# Patient Record
Sex: Female | Born: 1935 | Race: White | Hispanic: No | Smoking: Former smoker
Health system: Southern US, Community
[De-identification: ages and names within clinical notes are randomized; demographics above are authoritative.]

## PROBLEM LIST (undated history)

## (undated) DIAGNOSIS — M5416 Radiculopathy, lumbar region: Secondary | ICD-10-CM

## (undated) DIAGNOSIS — M1990 Unspecified osteoarthritis, unspecified site: Secondary | ICD-10-CM

## (undated) DIAGNOSIS — J45909 Unspecified asthma, uncomplicated: Secondary | ICD-10-CM

## (undated) DIAGNOSIS — M7580 Other shoulder lesions, unspecified shoulder: Secondary | ICD-10-CM

## (undated) DIAGNOSIS — I340 Nonrheumatic mitral (valve) insufficiency: Secondary | ICD-10-CM

## (undated) HISTORY — PX: TONSILLECTOMY: SUR1361

## (undated) HISTORY — DX: Nonrheumatic mitral (valve) insufficiency: I34.0

## (undated) HISTORY — DX: Other shoulder lesions, unspecified shoulder: M75.80

## (undated) HISTORY — DX: Unspecified osteoarthritis, unspecified site: M19.90

## (undated) HISTORY — DX: Radiculopathy, lumbar region: M54.16

## (undated) HISTORY — PX: EYE SURGERY: SHX253

---

## 1999-05-13 ENCOUNTER — Other Ambulatory Visit: Admission: RE | Admit: 1999-05-13 | Discharge: 1999-05-13 | Payer: Self-pay | Admitting: Internal Medicine

## 2006-05-10 ENCOUNTER — Encounter: Admission: RE | Admit: 2006-05-10 | Discharge: 2006-05-10 | Payer: Self-pay | Admitting: Internal Medicine

## 2008-01-06 HISTORY — PX: KNEE ARTHROSCOPY W/ MENISCAL REPAIR: SHX1877

## 2013-06-13 ENCOUNTER — Other Ambulatory Visit: Payer: Self-pay | Admitting: Orthopedic Surgery

## 2013-06-14 ENCOUNTER — Encounter (HOSPITAL_BASED_OUTPATIENT_CLINIC_OR_DEPARTMENT_OTHER): Payer: Self-pay | Admitting: *Deleted

## 2013-06-14 NOTE — Progress Notes (Signed)
Pt is extremely sharp and self care-drives, takes care of her home-very little health problems No labs needed

## 2013-06-19 NOTE — H&P (Signed)
  Loretta Todd is an 80 y.o. female.   Chief Complaint: c/o cystic mass left ring finger DIP joint HPI: Loretta Todd is a remarkable 80 year-old woman who looks 20 years younger than her stated age and acts 3930 years younger than her stated age.  She developed a mucoid cyst over the dorsoulnar aspect of her left ring finger three months prior.  She subsequently had a debridement of the cyst with a fairly rapid recurrence. She is now referred for an upper extremity orthopaedic consult.    Past Medical History  Diagnosis Date  . Asthma   . Arthritis     Past Surgical History  Procedure Laterality Date  . Eye surgery      both cataracts  . Knee arthroscopy w/ meniscal repair  2010    right  . Tonsillectomy      History reviewed. No pertinent family history. Social History:  reports that she quit smoking about 17 years ago. She does not have any smokeless tobacco history on file. She reports that she drinks alcohol. She reports that she does not use illicit drugs.  Allergies: No Known Allergies  No prescriptions prior to admission    No results found for this or any previous visit (from the past 48 hour(s)).  No results found.   Pertinent items are noted in HPI.  Height 5\' 4"  (1.626 m), weight 63.504 kg (140 lb).  General appearance: alert Head: Normocephalic, without obvious abnormality Neck: supple, symmetrical, trachea midline Resp: clear to auscultation bilaterally Cardio: regular rate and rhythm GI: normal findings: bowel sounds normal Extremities: .  Inspection of her hands reveals the stigmata of osteoarthritis including Heberden's and Bouchard's nodes.  She has angular deformities of multiple IP joints.  She is able to close her fingertips tight to the palm.  She has a very large 6 mm.  in diameter, thin distended mucoid cyst over the dorsoulnar aspect of her left ring finger.  She has no signs of infection in the form of cellulitis or septic arthritis.  Her neurovascular  examination is intact with intact sensibility to light touch, normal sweat patterns and dermatoglyphics. She has intact sensibility in the median, ulnar and radial distribution and intact pulses and capillary refill.    X-rays of her left ring finger, four views, demonstrate bone-on-bone arthropathy of the DIP joint. The mucoid cyst is easily visualized on plain film.   Pulses: 2+ and symmetric Skin: normal Neurologic: Grossly normal    Assessment/Plan Impression: Left ring finger mucoid cyst of DIP joint  Plan: To the OR for excision of cyst and DIP joint debridement and removal of loose bodies.The procedure, risks,benefits and post-op course were discussed with the patient at length and they were in agreement with the plan.   DASNOIT,Shadavia Dampier J 06/19/2013, 1:48 PM   H&P documentation: 06/20/2013  -History and Physical Reviewed  -Patient has been re-examined  -No change in the plan of care  Loretta Forsterobert V Jazper Nikolai Jr, MD

## 2013-06-20 ENCOUNTER — Ambulatory Visit (HOSPITAL_BASED_OUTPATIENT_CLINIC_OR_DEPARTMENT_OTHER): Payer: Medicare Other | Admitting: Anesthesiology

## 2013-06-20 ENCOUNTER — Encounter (HOSPITAL_BASED_OUTPATIENT_CLINIC_OR_DEPARTMENT_OTHER): Payer: Self-pay

## 2013-06-20 ENCOUNTER — Ambulatory Visit (HOSPITAL_BASED_OUTPATIENT_CLINIC_OR_DEPARTMENT_OTHER)
Admission: RE | Admit: 2013-06-20 | Discharge: 2013-06-20 | Disposition: A | Discharge status: Home / Self Care | Payer: Medicare Other | Source: Ambulatory Visit | Attending: Orthopedic Surgery | Admitting: Orthopedic Surgery

## 2013-06-20 ENCOUNTER — Encounter (HOSPITAL_BASED_OUTPATIENT_CLINIC_OR_DEPARTMENT_OTHER)
Admission: RE | Disposition: A | Discharge status: Home / Self Care | Payer: Self-pay | Source: Ambulatory Visit | Attending: Orthopedic Surgery

## 2013-06-20 ENCOUNTER — Encounter (HOSPITAL_BASED_OUTPATIENT_CLINIC_OR_DEPARTMENT_OTHER): Payer: Medicare Other | Admitting: Anesthesiology

## 2013-06-20 DIAGNOSIS — M19049 Primary osteoarthritis, unspecified hand: Secondary | ICD-10-CM | POA: Insufficient documentation

## 2013-06-20 DIAGNOSIS — L723 Sebaceous cyst: Secondary | ICD-10-CM | POA: Insufficient documentation

## 2013-06-20 DIAGNOSIS — Z87891 Personal history of nicotine dependence: Secondary | ICD-10-CM | POA: Insufficient documentation

## 2013-06-20 DIAGNOSIS — J45909 Unspecified asthma, uncomplicated: Secondary | ICD-10-CM | POA: Insufficient documentation

## 2013-06-20 HISTORY — PX: EAR CYST EXCISION: SHX22

## 2013-06-20 HISTORY — DX: Unspecified asthma, uncomplicated: J45.909

## 2013-06-20 SURGERY — CYST REMOVAL
Anesthesia: General | Site: Finger | Laterality: Left

## 2013-06-20 MED ORDER — FENTANYL CITRATE 0.05 MG/ML IJ SOLN
25.0000 ug | INTRAMUSCULAR | Status: DC | PRN
  Administered 2013-06-20 (×2): 25 ug via INTRAVENOUS

## 2013-06-20 MED ORDER — ONDANSETRON HCL 4 MG/2ML IJ SOLN: 4.0000 mg | Freq: Once | INTRAMUSCULAR | Status: DC | PRN

## 2013-06-20 MED ORDER — LIDOCAINE HCL 2 % IJ SOLN
INTRAMUSCULAR | Status: AC
  Filled 2013-06-20: qty 40

## 2013-06-20 MED ORDER — CHLORHEXIDINE GLUCONATE 4 % EX LIQD: 60.0000 mL | Freq: Once | CUTANEOUS | Status: DC

## 2013-06-20 MED ORDER — ACETAMINOPHEN-CODEINE 300-30 MG PO TABS: 1.0000 | ORAL_TABLET | ORAL | Status: DC | PRN

## 2013-06-20 MED ORDER — FENTANYL CITRATE 0.05 MG/ML IJ SOLN
INTRAMUSCULAR | Status: DC | PRN
  Administered 2013-06-20: 50 ug via INTRAVENOUS

## 2013-06-20 MED ORDER — MIDAZOLAM HCL 2 MG/2ML IJ SOLN: 1.0000 mg | INTRAMUSCULAR | Status: DC | PRN

## 2013-06-20 MED ORDER — FENTANYL CITRATE 0.05 MG/ML IJ SOLN
INTRAMUSCULAR | Status: AC
  Filled 2013-06-20: qty 4

## 2013-06-20 MED ORDER — CEPHALEXIN 500 MG PO CAPS: 500.0000 mg | ORAL_CAPSULE | Freq: Three times a day (TID) | ORAL | Status: DC

## 2013-06-20 MED ORDER — FENTANYL CITRATE 0.05 MG/ML IJ SOLN
INTRAMUSCULAR | Status: AC
  Filled 2013-06-20: qty 2

## 2013-06-20 MED ORDER — LIDOCAINE HCL 2 % IJ SOLN
INTRAMUSCULAR | Status: DC | PRN
  Administered 2013-06-20: 2 mL

## 2013-06-20 MED ORDER — PROPOFOL 10 MG/ML IV BOLUS
INTRAVENOUS | Status: DC | PRN
  Administered 2013-06-20: 50 mg via INTRAVENOUS

## 2013-06-20 MED ORDER — FENTANYL CITRATE 0.05 MG/ML IJ SOLN: 50.0000 ug | INTRAMUSCULAR | Status: DC | PRN

## 2013-06-20 MED ORDER — LACTATED RINGERS IV SOLN
INTRAVENOUS | Status: DC
  Administered 2013-06-20: 09:00:00 via INTRAVENOUS

## 2013-06-20 MED ORDER — MIDAZOLAM HCL 2 MG/2ML IJ SOLN
INTRAMUSCULAR | Status: AC
  Filled 2013-06-20: qty 2

## 2013-06-20 SURGICAL SUPPLY — 51 items
BANDAGE ADH SHEER 1  50/CT (GAUZE/BANDAGES/DRESSINGS) IMPLANT
BANDAGE ELASTIC 3 VELCRO ST LF (GAUZE/BANDAGES/DRESSINGS) IMPLANT
BLADE MINI RND TIP GREEN BEAV (BLADE) IMPLANT
BLADE SURG 15 STRL LF DISP TIS (BLADE) ×1 IMPLANT
BLADE SURG 15 STRL SS (BLADE) ×2
BNDG COHESIVE 1X5 TAN STRL LF (GAUZE/BANDAGES/DRESSINGS) ×3 IMPLANT
BNDG COHESIVE 3X5 TAN STRL LF (GAUZE/BANDAGES/DRESSINGS) IMPLANT
BNDG ESMARK 4X9 LF (GAUZE/BANDAGES/DRESSINGS) IMPLANT
BNDG GAUZE ELAST 4 BULKY (GAUZE/BANDAGES/DRESSINGS) IMPLANT
BRUSH SCRUB EZ PLAIN DRY (MISCELLANEOUS) ×3 IMPLANT
CORDS BIPOLAR (ELECTRODE) IMPLANT
COVER MAYO STAND STRL (DRAPES) ×3 IMPLANT
COVER TABLE BACK 60X90 (DRAPES) ×3 IMPLANT
CUFF TOURNIQUET SINGLE 18IN (TOURNIQUET CUFF) IMPLANT
DECANTER SPIKE VIAL GLASS SM (MISCELLANEOUS) ×3 IMPLANT
DRAIN PENROSE 1/2X12 LTX STRL (WOUND CARE) ×3 IMPLANT
DRAIN PENROSE 1/4X12 LTX STRL (WOUND CARE) IMPLANT
DRAPE EXTREMITY T 121X128X90 (DRAPE) ×3 IMPLANT
DRAPE SURG 17X23 STRL (DRAPES) ×3 IMPLANT
GAUZE SPONGE 4X4 12PLY STRL (GAUZE/BANDAGES/DRESSINGS) IMPLANT
GAUZE XEROFORM 1X8 LF (GAUZE/BANDAGES/DRESSINGS) ×3 IMPLANT
GLOVE BIOGEL M STRL SZ7.5 (GLOVE) ×3 IMPLANT
GLOVE BIOGEL PI IND STRL 7.0 (GLOVE) ×1 IMPLANT
GLOVE BIOGEL PI IND STRL 7.5 (GLOVE) ×1 IMPLANT
GLOVE BIOGEL PI INDICATOR 7.0 (GLOVE) ×2
GLOVE BIOGEL PI INDICATOR 7.5 (GLOVE) ×2
GLOVE ORTHO TXT STRL SZ7.5 (GLOVE) ×3 IMPLANT
GLOVE SURG SS PI 7.5 STRL IVOR (GLOVE) ×3 IMPLANT
GOWN STRL REUS W/ TWL LRG LVL3 (GOWN DISPOSABLE) ×1 IMPLANT
GOWN STRL REUS W/ TWL XL LVL3 (GOWN DISPOSABLE) ×2 IMPLANT
GOWN STRL REUS W/TWL LRG LVL3 (GOWN DISPOSABLE) ×2
GOWN STRL REUS W/TWL XL LVL3 (GOWN DISPOSABLE) ×4
NEEDLE 27GAX1X1/2 (NEEDLE) ×3 IMPLANT
NEEDLE BLUNT 17GA (NEEDLE) ×3 IMPLANT
NS IRRIG 1000ML POUR BTL (IV SOLUTION) ×3 IMPLANT
PACK BASIN DAY SURGERY FS (CUSTOM PROCEDURE TRAY) ×3 IMPLANT
PADDING CAST ABS 4INX4YD NS (CAST SUPPLIES)
PADDING CAST ABS COTTON 4X4 ST (CAST SUPPLIES) IMPLANT
SPLINT FINGER 3.25 911903 (SOFTGOODS) ×3 IMPLANT
STOCKINETTE 4X48 STRL (DRAPES) ×3 IMPLANT
SUT ETHILON 5 0 P 3 18 (SUTURE)
SUT MERSILENE 4 0 P 3 (SUTURE) IMPLANT
SUT NYLON ETHILON 5-0 P-3 1X18 (SUTURE) IMPLANT
SUT PROLENE 3 0 PS 2 (SUTURE) IMPLANT
SUT PROLENE 4 0 P 3 18 (SUTURE) ×3 IMPLANT
SYR 20CC LL (SYRINGE) ×3 IMPLANT
SYR 3ML 23GX1 SAFETY (SYRINGE) IMPLANT
SYR CONTROL 10ML LL (SYRINGE) ×3 IMPLANT
TOWEL OR 17X24 6PK STRL BLUE (TOWEL DISPOSABLE) ×3 IMPLANT
TRAY DSU PREP LF (CUSTOM PROCEDURE TRAY) ×3 IMPLANT
UNDERPAD 30X30 INCONTINENT (UNDERPADS AND DIAPERS) ×3 IMPLANT

## 2013-06-20 NOTE — Addendum Note (Signed)
Addendum created 06/20/13 1129 by Kerby Noraavid A Crews, MD   Modules edited: Clinical Notes   Clinical Notes:  File: [id number]; Pend: [id number]

## 2013-06-20 NOTE — Op Note (Signed)
NAMJudge Stall:  Outen, Dave             ACCOUNT NO.:  [id number]  MEDICAL RECORD NO.:  [id number]  LOCATION:                                 FACILITY:  PHYSICIAN:  Katy Fitchobert V. Lestat Golob, M.D.      DATE OF BIRTH:  DATE OF PROCEDURE:  06/20/2013 DATE OF DISCHARGE:                              OPERATIVE REPORT   PREOPERATIVE DIAGNOSIS:  Chronic left ring finger, degenerative arthritis, distal interphalangeal joint with large dorsal/ulnar mucoid cyst.  POSTOPERATIVE DIAGNOSIS:  Chronic left ring finger, degenerative arthritis, distal interphalangeal joint with large dorsal/ulnar mucoid cyst.  OPERATION:  Debridement of mucoid cyst, followed by formal DIP joint arthrotomy with synovectomy, debridement of marginal osteophytes, and irrigation.  OPERATING SURGEON:  Katy Fitchobert V. Duane Trias, MD.  ASSISTANT:  Marveen Reeksobert J. Dasnoit, PA-C  ANESTHESIA:  2% lidocaine, metacarpal head level block of left ring finger supplemented by IV sedation.  SUPERVISING ANESTHESIOLOGIST:  Sheldon Silvanavid Crews, M.D.  INDICATIONS:  Loretta Todd is an 80 year old woman referred for evaluation and management of a large mucoid cyst on the dorsal ulnar aspect of her left ring finger by Dr. Aris LotWalter Whitworth, attending dermatologist.  Dr. Doreen BeamWhitworth identified a classic mucoid cyst, and requested a Hand Surgery consult.  Ms. Loretta Todd was advised of potential risks and benefits of mucoid cyst surgery.  She understands we cannot affect the natural history of degenerative arthritis of the distal interphalangeal joint.  We will debride the cyst, irrigate the joint, perform a synovectomy, and remove loose bodies.  She understands there is a possibility that she could develop another mucoid cyst of this finger or other fingers.  Questions were invited and answered in detail.  Preoperatively, she was advised that we would provide perioperative antibiotics to try to diminish the risk of infection with joint entry.  Questions were  invited and answered in detail.  PROCEDURE:  Loretta Minksleanor Bulger was interviewed in the holding area and her left ring finger marked as a proper surgical site per protocol with a marking pen.  Dr. Ivin Bootyrews interviewed her and provided detailed anesthesia informed consent.  Digital block anesthesia with IV sedation was recommended and accepted.  PROCEDURE:  Loretta Minksleanor Stamas was then [facility], placed in supine position on the operating table.  Her left ring finger was marked as the proper surgical site per protocol with a marking pen.  The left hand and arm were prepped with Betadine soap and solution, sterilely draped.  A pneumatic tourniquet was deferred.  Our plan was to use a 0.5-inch Penrose drain as a digital tourniquet of the proximal phalangeal segment of the finger.  The finger was exsanguinated and a drain placed as the proper digital tourniquet.  A routine surgical time-out was accomplished.  We then performed a curvilinear incision incorporating the cyst in the distal aspect of the incision.  The cyst was debrided down to the periosteum. We then performed a formal capsulotomy with excision of the capsule between the terminal extensor tendon slip and the ulnar collateral ligament.  Marginal osteophytes at the base of distal phalanx, resected with a micro rongeur followed by synovectomy with a rongeur, followed by removal of a few fragments of  hyaline cartilage and use of a micro curette to remove osteophytes from the base of the distal phalanx.  We then irrigated the joint thoroughly with a 19-gauge blunt dental needle and sterile saline under pressure.  We then repaired the skin with intradermal 4-0 Prolene.  The finger wound was dressed with Xeroflo followed by direct pressure after tourniquet released.  Hemostasis was achieved by 5 minutes of direct pressure.  The wound was then dressed with sterile gauze.  An aluminum foam splint, and a  Coban dressing that was secured to the wrist.  No apparent complications.  For aftercare, Ms. Loretta Todd was provided prescription for Tylenol with Codeine No. 3 one or two tablets p.o. q.4-6 hours p.r.n. pain, 15 tablets refill.  She was encouraged to use either Tylenol or ibuprofen as a primary analgesic.  She is also provided a prescription for Keflex 500 mg 1 p.o. q.8 hours x4 days as a prophylactic antibiotic due to joint entry and bone debridement.     Katy Fitchobert V. Anyi Fels, M.D.     RVS/MEDQ  D:  06/20/2013  T:  06/20/2013  Job:  [id number]  cc:   Aris LotWalter Whitworth, MD

## 2013-06-20 NOTE — Discharge Instructions (Addendum)

## 2013-06-20 NOTE — Transfer of Care (Signed)
Immediate Anesthesia Transfer of Care Note  Patient: Loretta Todd  Procedure(s) Performed: Procedure(s): DEBRIDE MUCOID CYST/REMOVE LOOSE BODIES LEFT RING FINGER  (Left)  Patient Location: PACU  Anesthesia Type:MAC  Level of Consciousness: awake, alert  and oriented  Airway & Oxygen Therapy: Patient Spontanous Breathing  Post-op Assessment: Report given to PACU RN  Post vital signs: Reviewed and stable  Complications: No apparent anesthesia complications

## 2013-06-20 NOTE — Anesthesia Preprocedure Evaluation (Signed)
Anesthesia Evaluation  Patient identified by MRN, date of birth, ID band Patient awake    Reviewed: Allergy & Precautions, H&P , NPO status , Patient's Chart, lab work & pertinent test results  Airway Mallampati: I TM Distance: >3 FB Neck ROM: Full    Dental  (+) Teeth Intact, Dental Advisory Given   Pulmonary former smoker,  breath sounds clear to auscultation        Cardiovascular Rhythm:Regular Rate:Normal     Neuro/Psych    GI/Hepatic   Endo/Other    Renal/GU      Musculoskeletal   Abdominal   Peds  Hematology   Anesthesia Other Findings   Reproductive/Obstetrics                           Anesthesia Physical Anesthesia Plan  ASA: II  Anesthesia Plan: General   Post-op Pain Management:    Induction: Intravenous  Airway Management Planned: LMA  Additional Equipment:   Intra-op Plan:   Post-operative Plan: Extubation in OR  Informed Consent: I have reviewed the patients History and Physical, chart, labs and discussed the procedure including the risks, benefits and alternatives for the proposed anesthesia with the patient or authorized representative who has indicated his/her understanding and acceptance.   Dental advisory given  Plan Discussed with: CRNA, Anesthesiologist and Surgeon  Anesthesia Plan Comments:         Anesthesia Quick Evaluation  

## 2013-06-20 NOTE — Brief Op Note (Signed)
06/20/2013  10:09 AM  PATIENT:  Loretta Todd  80 y.o. female  PRE-OPERATIVE DIAGNOSIS:  Ganglion of tendon sheath [727.42] Localized osteoarthrosis not specified whether primary or secondary, hand [715.34] Articular cartilage disorder, hand [718.04]  POST-OPERATIVE DIAGNOSIS:  Ganglion of tendon sheath [727.42] Localized osteoarthrosis not specified whether primary or secondary, hand [715.34] Articular cartilage disorder, hand [718.04]  PROCEDURE:  Procedure(s): DEBRIDE MUCOID CYST/REMOVE LOOSE BODIES LEFT RING FINGER  (Left)  SURGEON:  Surgeon(s) and Role:    * Wyn Forsterobert Sypher Jr V, MD - Primary  PHYSICIAN ASSISTANT:   ASSISTANTS: Mallory Shirkobert Dasnoit,P.A-C    ANESTHESIA:   local  EBL:  Total I/O In: 450 [I.V.:450] Out: -   BLOOD ADMINISTERED:none  DRAINS: none   LOCAL MEDICATIONS USED:  XYLOCAINE   SPECIMEN:  No Specimen  DISPOSITION OF SPECIMEN:  N/A  COUNTS:  YES  TOURNIQUET:    DICTATION: .Other Dictation: Dictation Number [id number]  PLAN OF CARE: Discharge to home after PACU  PATIENT DISPOSITION:  PACU - hemodynamically stable.   Delay start of Pharmacological VTE agent (>24hrs) due to surgical blood loss or risk of bleeding: not applicable

## 2013-06-20 NOTE — Progress Notes (Signed)
Pt in PACU after excision of mucoid lesion by Dr. Teressa SenterSypher.  She had a 6-8 beat run of bradycardia at a rate of ~30.  She was unaware and completely asymptomatic.  ECG showed a LBBB that has been present for several years according to Dr. Earl Galasborne who was contacted for further follow up and advice.  He felt she could be managed as an outpatient and I am discharging her to her home with her daughter with instructions to wait for a call from Dr. Newell Coralsborne's office for cardiology follow up.  I have faxed a copy of the described events to Dr. Earl Galasborne and will inform Dr. Teressa SenterSypher of the plan.

## 2013-06-20 NOTE — Anesthesia Postprocedure Evaluation (Signed)
  Anesthesia Post-op Note  Patient: Loretta Todd  Procedure(s) Performed: Procedure(s): DEBRIDE MUCOID CYST/REMOVE LOOSE BODIES LEFT RING FINGER  (Left)  Patient Location: PACU  Anesthesia Type:MAC  Level of Consciousness: awake, alert  and oriented  Airway and Oxygen Therapy: Patient Spontanous Breathing  Post-op Pain: none  Post-op Assessment: Post-op Vital signs reviewed  Post-op Vital Signs: Reviewed  Last Vitals:  Filed Vitals:   06/20/13 1015  BP: 138/70  Pulse: 69  Temp:   Resp: 14    Complications: No apparent anesthesia complications

## 2013-06-20 NOTE — Op Note (Signed)
[id number]

## 2013-06-21 ENCOUNTER — Encounter (HOSPITAL_BASED_OUTPATIENT_CLINIC_OR_DEPARTMENT_OTHER): Payer: Self-pay | Admitting: Orthopedic Surgery

## 2013-06-21 LAB — POCT HEMOGLOBIN-HEMACUE: HEMOGLOBIN: 14.9 g/dL (ref 12.0–15.0)

## 2013-06-21 NOTE — Addendum Note (Signed)
Addendum created 06/21/13 [id number] by Lance CoonWesley Webster, CRNA   Modules edited: Charges VN

## 2013-06-27 ENCOUNTER — Encounter: Payer: Self-pay | Admitting: Cardiology

## 2013-06-27 ENCOUNTER — Encounter: Payer: Self-pay | Admitting: General Surgery

## 2013-06-27 ENCOUNTER — Encounter (INDEPENDENT_AMBULATORY_CARE_PROVIDER_SITE_OTHER): Payer: Medicare Other

## 2013-06-27 ENCOUNTER — Ambulatory Visit (INDEPENDENT_AMBULATORY_CARE_PROVIDER_SITE_OTHER): Payer: Medicare Other | Admitting: Cardiology

## 2013-06-27 ENCOUNTER — Encounter: Payer: Self-pay | Admitting: *Deleted

## 2013-06-27 VITALS — BP 122/64 | HR 91 | Ht 64.0 in | Wt 144.0 lb

## 2013-06-27 DIAGNOSIS — I447 Left bundle-branch block, unspecified: Secondary | ICD-10-CM

## 2013-06-27 DIAGNOSIS — R001 Bradycardia, unspecified: Secondary | ICD-10-CM

## 2013-06-27 DIAGNOSIS — I442 Atrioventricular block, complete: Secondary | ICD-10-CM | POA: Insufficient documentation

## 2013-06-27 DIAGNOSIS — I498 Other specified cardiac arrhythmias: Secondary | ICD-10-CM

## 2013-06-27 NOTE — Patient Instructions (Signed)
Your physician recommends that you continue on your current medications as directed. Please refer to the Current Medication list given to you today.  Your physician has recommended that you wear an event monitor. Event monitors are medical devices that record the heart's electrical activity. Doctors most often us these monitors to diagnose arrhythmias. Arrhythmias are problems with the speed or rhythm of the heartbeat. The monitor is a small, portable device. You can wear one while you do your normal daily activities. This is usually used to diagnose what is causing palpitations/syncope (passing out). (schedule ASAP pt leaves for beach after first week of July)  Your physician recommends that you schedule a follow-up appointment in: 4 Weeks with Dr Mayford Knifeurner

## 2013-06-27 NOTE — Progress Notes (Signed)
  [address] [place], [place]  [address] Phone: [phone](336) [phone] Fax:  [phone]) [phone]  Date:  06/27/2013   ID:  Loretta Todd, DOB [birth year], MRN [id number]  PCP:  Darnelle BosSBORNE,JAMES CHARLES, MD  Cardiologist:  Armanda Magicraci Turner, MD     History of Present Illness: Loretta Todd is a 80 y.o. female with a history of asthma who was recently noted in the hospital to have a 6-8 beat run of HR of 30 post op after excision of a mucoid cyst on her finger.  Telemetry strip in PACU showed NSR with 3rd degree AV block.  This occurred in the setting of pain from her finger.  EKG showed LBBB which has been present for several years.  He is not here for further evaluation.  She denies any history of chest pain, SOB, DOE, palpitations, dizziness or syncope.   Wt Readings from Last 3 Encounters:  06/27/13 144 lb (65.318 kg)  06/20/13 143 lb 2 oz (64.921 kg)  06/20/13 143 lb 2 oz (64.921 kg)     Past Medical History  Diagnosis Date  . Asthma   . Arthritis   . OA (osteoarthritis)     DIPs and PIPs  . Lumbar radiculopathy   . Mitral late systolic murmur   . Rotator cuff tendinitis     Current Outpatient Prescriptions  Medication Sig Dispense Refill  . fluticasone-salmeterol (ADVAIR HFA) 115-21 MCG/ACT inhaler Inhale 2 puffs into the lungs 2 (two) times daily.      Marland Kitchen. ibuprofen (ADVIL,MOTRIN) 200 MG tablet Take 200 mg by mouth 2 (two) times daily.       No current facility-administered medications for this visit.    Allergies:   No Known Allergies  Social History:  The patient  reports that she quit smoking about 17 years ago. She does not have any smokeless tobacco history on file. She reports that she drinks alcohol. She reports that she does not use illicit drugs.   Family History:  The patient's family history is not on file.   ROS:  Please see the history of present illness.      All other systems reviewed and negative.   PHYSICAL EXAM: VS:  BP 122/64  Pulse 91  Ht 5\' 4"   (1.626 m)  Wt 144 lb (65.318 kg)  BMI 24.71 kg/m2 Well nourished, well developed, in no acute distress HEENT: normal Neck: no JVD Cardiac:  normal S1, S2; RRR; no murmur Lungs:  clear to auscultation bilaterally, no wheezing, rhonchi or rales Abd: soft, nontender, no hepatomegaly Ext: no edema Skin: warm and dry Neuro:  CNs 2-12 intact, no focal abnormalities noted  EKG:  NSR with LBBB.  Telemetry showed NSR with transient 3rd degree AV block  ASSESSMENT AND PLAN:  1. Transient bradycardia with heart block which was post op and asymptomatic.  This was probably vagally mediated post op.  She is asymptomatic.  I will get an event monitor to assess for transient high grade AV block given her underlying LBBB. 2. Chronic LBBB - this was present at the time of her nuclear stress test in 2004 that showed no ischemia and normal LVF EF 81%  Followup with me in 4 weeks  Signed, Armanda Magicraci Turner, MD 06/27/2013 1:43 PM

## 2013-06-27 NOTE — Progress Notes (Signed)
Patient ID: Loretta Todd, female   DOB: 09-Aug-1921, 80 y.o.   MRN: [id number] [employer] 15 day cardiac event monitor applied to patient.

## 2013-07-25 ENCOUNTER — Telehealth: Payer: Self-pay | Admitting: Cardiology

## 2013-07-25 ENCOUNTER — Encounter: Payer: Self-pay | Admitting: General Surgery

## 2013-07-25 NOTE — Telephone Encounter (Signed)
Please let patient know that heart monitor showed NSR with frequent PAC's and PVC's which are benign

## 2013-07-25 NOTE — Telephone Encounter (Signed)
lmtrc

## 2013-07-26 NOTE — Telephone Encounter (Signed)
LMTRC

## 2013-07-27 ENCOUNTER — Encounter: Payer: Self-pay | Admitting: General Surgery

## 2013-07-27 ENCOUNTER — Telehealth: Payer: Self-pay | Admitting: Internal Medicine

## 2013-07-27 NOTE — Telephone Encounter (Signed)
lmtrc

## 2013-07-27 NOTE — Telephone Encounter (Signed)
ERROR

## 2013-07-27 NOTE — Telephone Encounter (Signed)
Letter sent to pt to make aware.  

## 2013-08-02 ENCOUNTER — Ambulatory Visit (INDEPENDENT_AMBULATORY_CARE_PROVIDER_SITE_OTHER): Payer: Medicare Other | Admitting: Cardiology

## 2013-08-02 ENCOUNTER — Encounter: Payer: Self-pay | Admitting: Cardiology

## 2013-08-02 VITALS — BP 153/85 | HR 88 | Ht 64.0 in | Wt 144.8 lb

## 2013-08-02 DIAGNOSIS — I498 Other specified cardiac arrhythmias: Secondary | ICD-10-CM

## 2013-08-02 DIAGNOSIS — I447 Left bundle-branch block, unspecified: Secondary | ICD-10-CM

## 2013-08-02 DIAGNOSIS — I442 Atrioventricular block, complete: Secondary | ICD-10-CM

## 2013-08-02 DIAGNOSIS — R001 Bradycardia, unspecified: Secondary | ICD-10-CM

## 2013-08-02 NOTE — Patient Instructions (Signed)
Your physician recommends that you continue on your current medications as directed. Please refer to the Current Medication list given to you today.  Your physician recommends that you schedule a follow-up appointment as needed with Dr Turner 

## 2013-08-02 NOTE — Progress Notes (Signed)
  [address] [place], [place]  [address] Phone: [phone](336) [phone] Fax:  [phone]  Date:  08/02/2013   ID:  Loretta Todd, DOB 1921/11/26, MRN [id number]  PCP:  Darnelle BosSBORNE,JAMES CHARLES, MD  Cardiologist:  Armanda Magicraci Akiyah Eppolito, MD     History of Present Illness: Loretta Spikeleanor S Shone is a 80 y.o. female with a history of asthma who was recently noted in the hospital to have a 6-8 beat run of HR of 30 post op after excision of a mucoid cyst on her finger. Telemetry strip in PACU showed NSR with 3rd degree AV block. This occurred in the setting of pain from her finger. EKG showed LBBB which has been present for several years and at the time of her nuclear stress test in 2004 that was normal. It was thought that this was vagally mediated post op. She denies any history of chest pain, SOB, DOE, palpitations, dizziness or syncope.  She wore a heart monitor recently and now presents back for followup.  This showed NSR with PAC's and PVC's.      Wt Readings from Last 3 Encounters:  08/02/13 144 lb 12.8 oz (65.681 kg)  06/27/13 144 lb (65.318 kg)  06/20/13 143 lb 2 oz (64.921 kg)     Past Medical History  Diagnosis Date  . Asthma   . Arthritis   . OA (osteoarthritis)     DIPs and PIPs  . Lumbar radiculopathy   . Mitral late systolic murmur   . Rotator cuff tendinitis     Current Outpatient Prescriptions  Medication Sig Dispense Refill  . fluticasone-salmeterol (ADVAIR HFA) 115-21 MCG/ACT inhaler Inhale 2 puffs into the lungs 2 (two) times daily.      Marland Kitchen. ibuprofen (ADVIL,MOTRIN) 200 MG tablet Take 200 mg by mouth 2 (two) times daily.       No current facility-administered medications for this visit.    Allergies:   No Known Allergies  Social History:  The patient  reports that she quit smoking about 17 years ago. She does not have any smokeless tobacco history on file. She reports that she drinks alcohol. She reports that she does not use illicit drugs.   Family History:  The  patient's family history is not on file.   ROS:  Please see the history of present illness.      All other systems reviewed and negative.   PHYSICAL EXAM: VS:  BP 153/85  Pulse 88  Ht 5\' 4"  (1.626 m)  Wt 144 lb 12.8 oz (65.681 kg)  BMI 24.84 kg/m2 Well nourished, well developed, in no acute distress HEENT: normal Neck: no JVD Cardiac:  normal S1, S2; RRR; no murmur Lungs:  clear to auscultation bilaterally, no wheezing, rhonchi or rales Abd: soft, nontender, no hepatomegaly Ext: no edema Skin: warm and dry Neuro:  CNs 2-12 intact, no focal abnormalities noted       ASSESSMENT AND PLAN:  1. Transient high grade AV block post op from finger surgery with no reoccurence.  Heart monitor did not show any further AV block.  2. PVC's and PAC's - asymptomatic 3. Chronic LBBB with no ischemia at time of nuc 2004  Followup with me PRN  Signed, Armanda Magicraci Jasman Murri, MD 08/02/2013 2:10 PM

## 2013-09-07 ENCOUNTER — Ambulatory Visit (INDEPENDENT_AMBULATORY_CARE_PROVIDER_SITE_OTHER): Payer: Medicare Other | Admitting: Podiatry

## 2013-09-07 ENCOUNTER — Encounter: Payer: Self-pay | Admitting: Podiatry

## 2013-09-07 VITALS — BP 152/90 | HR 84 | Resp 16

## 2013-09-07 DIAGNOSIS — L600 Ingrowing nail: Secondary | ICD-10-CM

## 2013-09-07 NOTE — Patient Instructions (Signed)

## 2013-09-07 NOTE — Progress Notes (Signed)
Subjective:     Patient ID: Loretta Todd, female   DOB: [birth year], 80 y.o.   MRN: [id number]  Toe Pain    patient presents with incurvated hallux borders lateral side on both feet that she states she cannot cut out and has tried to soak and cut out herself without relief of pain or symptoms. States it's been present for a few months and has gotten worse over the last week or 2   Review of Systems  All other systems reviewed and are negative.      Objective:   Physical Exam  Nursing note and vitals reviewed. Constitutional: She is oriented to person, place, and time.  Cardiovascular: Intact distal pulses.   Musculoskeletal: Normal range of motion.  Neurological: She is oriented to person, place, and time.  Skin: Skin is warm.   neurovascular status intact with muscle strength adequate and range of motion subtalar midtarsal joint within normal limits. Patient's found to have incurvated hallux nail bilateral lateral borders with pain and is found to have excellent digital perfusion hair growth and no varicosities. Patient is in very good health and is well oriented x3    Assessment:     Chronic ingrown toenail hallux both feet lateral border    Plan:     H&P and condition discussed. I reviewed the procedure and all risk associated with removal of the corners. Patient would like to have these fixed understanding risk and recovery and today I infiltrated each hallux 60 mg Xylocaine Marcaine mixture remove the lateral border exposed matrix and apply chemical phenol 3 applications followed by alcohol lavaged and sterile dressing. Gave instructions on soaks and reappoint

## 2013-09-07 NOTE — Progress Notes (Signed)
   Subjective:    Patient ID: Loretta Todd, female    DOB: [birth year], 80 y.o.   MRN: [id number]  HPI Comments: "I have 2 ingrown toenails"  Patient c/o tender 1st toe bilateral, both borders, for about 1 week. The area is red and swollen. Soaking and trimming at home.  Toe Pain       Review of Systems  Genitourinary: Positive for frequency.  Hematological: Bruises/bleeds easily.  All other systems reviewed and are negative.      Objective:   Physical Exam        Assessment & Plan:

## 2013-09-20 ENCOUNTER — Encounter: Payer: Self-pay | Admitting: Podiatry

## 2013-09-20 ENCOUNTER — Ambulatory Visit (INDEPENDENT_AMBULATORY_CARE_PROVIDER_SITE_OTHER): Payer: Medicare Other | Admitting: Podiatry

## 2013-09-20 VITALS — BP 136/75 | HR 86 | Resp 16

## 2013-09-20 DIAGNOSIS — L03039 Cellulitis of unspecified toe: Secondary | ICD-10-CM

## 2013-09-20 DIAGNOSIS — L600 Ingrowing nail: Secondary | ICD-10-CM

## 2013-09-20 MED ORDER — CEPHALEXIN 500 MG PO CAPS: 500.0000 mg | ORAL_CAPSULE | Freq: Two times a day (BID) | ORAL | Status: DC

## 2013-09-21 NOTE — Progress Notes (Signed)
Subjective:     Patient ID: Loretta Todd, female   DOB: 1921-08-03, 80 y.o.   MRN: [id number]  HPI patient presents concerned that she has some redness around the sites where we fixed her ingrown toenails on both big toes. States she's noticed a little more on the right and a little more painful for   Review of Systems     Objective:   Physical Exam Neurovascular status unchanged with incurvated nail borders hallux lateral border bilateral that had been removed with slight proximal redness that is localized with no drainage currently and no proximal edema erythema or drainage into the foot    Assessment:     Possible mild local paronychia infection of the hallux right over left    Plan:     Educated her on continuing soaks allowing him to air dry and dry bandaging technique. Also went ahead today as precautionary measure called in cephalexin 500 mg twice a day for the next 10 days and reappoint for [place] to recheck again as needed

## 2013-09-28 ENCOUNTER — Telehealth: Payer: Self-pay | Admitting: *Deleted

## 2013-09-28 ENCOUNTER — Ambulatory Visit (INDEPENDENT_AMBULATORY_CARE_PROVIDER_SITE_OTHER): Payer: Medicare Other | Admitting: Podiatry

## 2013-09-28 ENCOUNTER — Encounter: Payer: Self-pay | Admitting: Podiatry

## 2013-09-28 VITALS — BP 130/80 | HR 72 | Temp 98.2°F | Resp 15

## 2013-09-28 DIAGNOSIS — L03039 Cellulitis of unspecified toe: Secondary | ICD-10-CM

## 2013-09-28 DIAGNOSIS — B351 Tinea unguium: Secondary | ICD-10-CM | POA: Diagnosis not present

## 2013-09-28 MED ORDER — CEPHALEXIN 500 MG PO CAPS: 500.0000 mg | ORAL_CAPSULE | Freq: Two times a day (BID) | ORAL | Status: DC

## 2013-09-28 NOTE — Telephone Encounter (Signed)
I'm a patient of Dr. Charlsie Merles and I'd like to have him call me this afternoon sometime, that would be wonderful.  In the mean time you can give me a call and can let you know what's on my mind.

## 2013-09-29 NOTE — Telephone Encounter (Signed)
I called the patient.  She said she saw Dr. Charlsie Merles yesterday.

## 2013-09-29 NOTE — Progress Notes (Signed)
Subjective:     Patient ID: Loretta Todd, female   DOB: May 18, 1921, 80 y.o.   MRN: [id number]  HPI patient presents stating I still have some discomfort on the big toes of both feet and I was just concerned because there's redness and I want to rest of my toenails cut. States they do not hurt but they're too thick for her to take care of   Review of Systems     Objective:   Physical Exam Neurovascular status unchanged with patient well oriented x3 and presented with daughter. There is localized crusted the lateral side of the hallux both feet and a light pink color but there is no proximal edema erythema or drainage noted teary at nailbeds of remaining nails are not painful but she cannot cut    Assessment:     Localized healing with possible mild localized paronychia infection hallux both feet and nail disease 1-5 of both    Plan:     Continue antibiotic for 10 more days and explained that this Should fall off over the next few weeks but that it may take a little bit time and debridement nailbeds 1-5 both feet with no iatrogenic bleeding noted

## 2013-11-13 ENCOUNTER — Ambulatory Visit (INDEPENDENT_AMBULATORY_CARE_PROVIDER_SITE_OTHER): Payer: Medicare Other | Admitting: Podiatry

## 2013-11-13 DIAGNOSIS — M79673 Pain in unspecified foot: Secondary | ICD-10-CM

## 2013-11-13 DIAGNOSIS — B351 Tinea unguium: Secondary | ICD-10-CM

## 2013-11-14 NOTE — Progress Notes (Signed)
Subjective:     Patient ID: Loretta Todd, female   DOB: July 31, 1921, 80 y.o.   MRN: [id number]  HPIpatient's found to have nail disease 1-5 both feet that are thick and painful when pressed and makes shoe gear difficult. States that she cannot cut herself   Review of Systems     Objective:   Physical Exam Neurovascular status is intact with thick yellow brittle nailbeds 1-5 both feet that are painful when palpated    Assessment:     Mycotic nail infection with pain 1-5 both feet    Plan:     Debride painful nailbeds 1-5 both feet with no iatrogenic bleeding noted

## 2014-01-01 ENCOUNTER — Other Ambulatory Visit: Payer: Medicare Other

## 2014-01-15 ENCOUNTER — Ambulatory Visit (INDEPENDENT_AMBULATORY_CARE_PROVIDER_SITE_OTHER): Payer: Medicare Other | Admitting: Podiatry

## 2014-01-15 DIAGNOSIS — B351 Tinea unguium: Secondary | ICD-10-CM | POA: Diagnosis not present

## 2014-01-15 DIAGNOSIS — M79673 Pain in unspecified foot: Secondary | ICD-10-CM | POA: Diagnosis not present

## 2014-01-15 NOTE — Progress Notes (Signed)
Subjective:     Patient ID: Loretta Todd, female   DOB: 12/17/1921, 81 y.o.   MRN: [id number]  HPIpatient's found to have nail disease 1-5 both feet that are thick and painful when pressed and makes shoe gear difficult. States that she cannot cut herself   Review of Systems     Objective:   Physical Exam Neurovascular status is intact with thick yellow brittle nailbeds 1-5 both feet that are painful when palpated    Assessment:     Mycotic nail infection with pain 1-5 both feet    Plan:     Debride painful nailbeds 1-5 both feet with no iatrogenic bleeding noted      

## 2014-04-05 ENCOUNTER — Encounter: Payer: Self-pay | Admitting: Podiatry

## 2014-04-05 ENCOUNTER — Ambulatory Visit (INDEPENDENT_AMBULATORY_CARE_PROVIDER_SITE_OTHER): Payer: Medicare Other | Admitting: Podiatry

## 2014-04-05 DIAGNOSIS — M79673 Pain in unspecified foot: Secondary | ICD-10-CM

## 2014-04-05 DIAGNOSIS — B351 Tinea unguium: Secondary | ICD-10-CM | POA: Diagnosis not present

## 2014-04-05 NOTE — Progress Notes (Signed)
Subjective:     Patient ID: Loretta Todd, female   DOB: 10/27/1921, 81 y.o.   MRN: [id number]  HPIpatient's found to have nail disease 1-5 both feet that are thick and painful when pressed and makes shoe gear difficult. States that she cannot cut herself   Review of Systems     Objective:   Physical Exam Neurovascular status is intact with thick yellow brittle nailbeds 1-5 both feet that are painful when palpated    Assessment:     Mycotic nail infection with pain 1-5 both feet    Plan:     Debride painful nailbeds 1-5 both feet with no iatrogenic bleeding noted      

## 2014-04-23 ENCOUNTER — Ambulatory Visit: Payer: Medicare Other

## 2014-06-11 ENCOUNTER — Ambulatory Visit (INDEPENDENT_AMBULATORY_CARE_PROVIDER_SITE_OTHER): Payer: Medicare Other | Admitting: Podiatry

## 2014-06-11 DIAGNOSIS — M79673 Pain in unspecified foot: Secondary | ICD-10-CM

## 2014-06-11 DIAGNOSIS — B351 Tinea unguium: Secondary | ICD-10-CM

## 2014-06-11 NOTE — Progress Notes (Signed)
Subjective:     Patient ID: Loretta Todd, female   DOB: May 11, 1921, 81 y.o.   MRN: [id number]  HPIpatient's found to have nail disease 1-5 both feet that are thick and painful when pressed and makes shoe gear difficult. States that she cannot cut herself   Review of Systems     Objective:   Physical Exam Neurovascular status is intact with thick yellow brittle nailbeds 1-5 both feet that are painful when palpated    Assessment:     Mycotic nail infection with pain 1-5 both feet    Plan:     Debride painful nailbeds 1-5 both feet with no iatrogenic bleeding noted

## 2014-08-30 ENCOUNTER — Ambulatory Visit: Payer: Medicare Other | Admitting: Podiatry

## 2014-08-30 ENCOUNTER — Ambulatory Visit (INDEPENDENT_AMBULATORY_CARE_PROVIDER_SITE_OTHER): Payer: Medicare Other | Admitting: Podiatry

## 2014-08-30 VITALS — BP 128/82 | HR 73 | Resp 16

## 2014-08-30 DIAGNOSIS — B351 Tinea unguium: Secondary | ICD-10-CM

## 2014-08-30 DIAGNOSIS — M79673 Pain in unspecified foot: Secondary | ICD-10-CM

## 2014-08-30 NOTE — Progress Notes (Signed)
Subjective:     Patient ID: Loretta Todd, female   DOB: [birth year], 81 y.o.   MRN: [id number]  HPIpatient's found to have nail disease 1-5 both feet that are thick and painful when pressed and makes shoe gear difficult. States that she cannot cut herself. She would like to have him continue to become a regular basis. She states that 3 months may be too long.   Review of Systems     Objective:   Physical Exam Neurovascular status is intact with thick yellow brittle nailbeds 1-5 both feet that are painful when palpated. No open wounds or lesions.    Assessment:     Mycotic nail infection with pain 1-5 both feet. Pain in limb secondary to onychomycosis.     Plan:     Debride painful nailbeds 1-5 both feet with no iatrogenic bleeding noted.   Arbutus Ped DPM

## 2014-12-05 ENCOUNTER — Ambulatory Visit (INDEPENDENT_AMBULATORY_CARE_PROVIDER_SITE_OTHER): Payer: Medicare Other | Admitting: Podiatry

## 2014-12-05 DIAGNOSIS — M779 Enthesopathy, unspecified: Secondary | ICD-10-CM

## 2014-12-05 DIAGNOSIS — B351 Tinea unguium: Secondary | ICD-10-CM

## 2014-12-05 DIAGNOSIS — M79673 Pain in unspecified foot: Secondary | ICD-10-CM

## 2014-12-05 NOTE — Progress Notes (Signed)
Subjective:     Patient ID: Loretta Todd, female   DOB: 1921/02/15, 81 y.o.   MRN: [id number]  HPI patient presents with thick yellow brittle nailbeds 1-5 both feet and inflammation on the inside of the left arch which can bother her quite a bit at times   Review of Systems     Objective:   Physical Exam Neurovascular status unchanged with thick yellow brittle nailbeds 1-5 both feet and also inflammation on the left foot medial side that's painful when pressed    Assessment:     Mycotic nail infection tendinitis left    Plan:     H&P conditions reviewed and discussed tendinitis and treatments. Today I debrided nails and patient be seen back for routine care and possible tendon treatment

## 2015-03-04 ENCOUNTER — Ambulatory Visit (INDEPENDENT_AMBULATORY_CARE_PROVIDER_SITE_OTHER): Payer: Medicare Other | Admitting: Podiatry

## 2015-03-04 DIAGNOSIS — B351 Tinea unguium: Secondary | ICD-10-CM | POA: Diagnosis not present

## 2015-03-04 DIAGNOSIS — M79673 Pain in unspecified foot: Secondary | ICD-10-CM

## 2015-03-05 NOTE — Progress Notes (Signed)
Subjective:     Patient ID: Loretta Todd, female   DOB: 11-19-1921, 82 y.o.   MRN: [id number]  HPI patient presents with nail disease 1-5 both feet with yellow brittle debris and pain when pressed   Review of Systems     Objective:   Physical Exam Neurovascular status intact with thick yellow brittle nailbeds 1-5 both feet that are painful and the corners and she cannot cut    Assessment:     Mycotic nail infections with pain 1-5 both feet    Plan:     Debride painful nailbeds 1-5 both feet with no iatrogenic bleeding noted

## 2015-03-07 DIAGNOSIS — R350 Frequency of micturition: Secondary | ICD-10-CM | POA: Insufficient documentation

## 2015-03-07 DIAGNOSIS — M1611 Unilateral primary osteoarthritis, right hip: Secondary | ICD-10-CM | POA: Insufficient documentation

## 2015-03-07 DIAGNOSIS — M6740 Ganglion, unspecified site: Secondary | ICD-10-CM | POA: Insufficient documentation

## 2015-03-07 DIAGNOSIS — J45909 Unspecified asthma, uncomplicated: Secondary | ICD-10-CM | POA: Insufficient documentation

## 2015-05-27 ENCOUNTER — Ambulatory Visit (INDEPENDENT_AMBULATORY_CARE_PROVIDER_SITE_OTHER): Payer: Medicare Other | Admitting: Podiatry

## 2015-05-27 DIAGNOSIS — B351 Tinea unguium: Secondary | ICD-10-CM | POA: Diagnosis not present

## 2015-05-27 DIAGNOSIS — M79673 Pain in unspecified foot: Secondary | ICD-10-CM | POA: Diagnosis not present

## 2015-05-28 NOTE — Progress Notes (Signed)
Subjective:     Patient ID: Loretta Todd, female   DOB: [birth year], 82 y.o.   MRN: [id number]  HPI patient presents stating I have thick yellow nailbeds 1-5 both feet and they're painful and I cannot cut them   Review of Systems     Objective:   Physical Exam Neurovascular status intact with thick yellow brittle nailbeds 1-5 both feet that are painful corners and difficult to cut    Assessment:     Mycotic nail infection with pain 1-5 both feet    Plan:     Debride painful nailbeds 1-5 both feet with no iatrogenic bleeding noted

## 2015-07-01 DIAGNOSIS — M150 Primary generalized (osteo)arthritis: Secondary | ICD-10-CM

## 2015-07-01 DIAGNOSIS — M159 Polyosteoarthritis, unspecified: Secondary | ICD-10-CM | POA: Insufficient documentation

## 2015-07-01 DIAGNOSIS — R252 Cramp and spasm: Secondary | ICD-10-CM | POA: Insufficient documentation

## 2015-08-26 ENCOUNTER — Ambulatory Visit (INDEPENDENT_AMBULATORY_CARE_PROVIDER_SITE_OTHER): Payer: Medicare Other | Admitting: Podiatry

## 2015-08-26 DIAGNOSIS — M79673 Pain in unspecified foot: Secondary | ICD-10-CM | POA: Diagnosis not present

## 2015-08-26 DIAGNOSIS — B351 Tinea unguium: Secondary | ICD-10-CM

## 2015-08-26 NOTE — Progress Notes (Signed)
Subjective:     Patient ID: Loretta Todd, female   DOB: 1921-01-28, 82 y.o.   MRN: [id number]  HPI patient presents with thick nails 1-5 both feet that are bothersome and she cannot wear shoe gear with   Review of Systems     Objective:   Physical Exam Neurovascular status intact with thick yellow brittle nailbeds 1-5 both feet that become painful and she cannot cut    Assessment:     Mycotic nail infections with pain 1-5 both feet    Plan:     Debride painful nailbeds 1-5 both feet with no iatrogenic bleeding noted

## 2015-12-02 ENCOUNTER — Ambulatory Visit (INDEPENDENT_AMBULATORY_CARE_PROVIDER_SITE_OTHER): Payer: Medicare Other | Admitting: Podiatry

## 2015-12-02 DIAGNOSIS — B351 Tinea unguium: Secondary | ICD-10-CM | POA: Diagnosis not present

## 2015-12-02 DIAGNOSIS — M79675 Pain in left toe(s): Secondary | ICD-10-CM | POA: Diagnosis not present

## 2015-12-02 DIAGNOSIS — M79674 Pain in right toe(s): Secondary | ICD-10-CM | POA: Diagnosis not present

## 2015-12-02 NOTE — Progress Notes (Signed)
Subjective:     Patient ID: Loretta Todd, female   DOB: 07/10/1921, 82 y.o.   MRN: [id number]  HPI patient presents with thick nails 1-5 both feet that are bothersome and she cannot wear shoe gear with   Review of Systems     Objective:   Physical Exam Neurovascular status intact with thick yellow brittle nailbeds 1-5 both feet that become painful and she cannot cut    Assessment:     Mycotic nail infections with pain 1-5 both feet    Plan:     Debride painful nailbeds 1-5 both feet with no iatrogenic bleeding noted     

## 2016-01-28 ENCOUNTER — Inpatient Hospital Stay (HOSPITAL_COMMUNITY)
Admission: EM | Admit: 2016-01-28 | Discharge: 2016-02-01 | DRG: 871 | Disposition: A | Discharge status: Home Health | Payer: Medicare Other | Attending: Internal Medicine | Admitting: Internal Medicine

## 2016-01-28 ENCOUNTER — Encounter (HOSPITAL_COMMUNITY): Payer: Self-pay | Admitting: *Deleted

## 2016-01-28 ENCOUNTER — Emergency Department (HOSPITAL_COMMUNITY): Payer: Medicare Other

## 2016-01-28 ENCOUNTER — Inpatient Hospital Stay (HOSPITAL_COMMUNITY): Payer: Medicare Other

## 2016-01-28 DIAGNOSIS — I4581 Long QT syndrome: Secondary | ICD-10-CM | POA: Diagnosis present

## 2016-01-28 DIAGNOSIS — J111 Influenza due to unidentified influenza virus with other respiratory manifestations: Secondary | ICD-10-CM | POA: Diagnosis not present

## 2016-01-28 DIAGNOSIS — J189 Pneumonia, unspecified organism: Secondary | ICD-10-CM | POA: Diagnosis present

## 2016-01-28 DIAGNOSIS — I480 Paroxysmal atrial fibrillation: Secondary | ICD-10-CM | POA: Diagnosis not present

## 2016-01-28 DIAGNOSIS — I447 Left bundle-branch block, unspecified: Secondary | ICD-10-CM | POA: Diagnosis present

## 2016-01-28 DIAGNOSIS — Z79899 Other long term (current) drug therapy: Secondary | ICD-10-CM | POA: Diagnosis not present

## 2016-01-28 DIAGNOSIS — Z87891 Personal history of nicotine dependence: Secondary | ICD-10-CM | POA: Diagnosis not present

## 2016-01-28 DIAGNOSIS — Z66 Do not resuscitate: Secondary | ICD-10-CM | POA: Diagnosis present

## 2016-01-28 DIAGNOSIS — J9600 Acute respiratory failure, unspecified whether with hypoxia or hypercapnia: Secondary | ICD-10-CM

## 2016-01-28 DIAGNOSIS — W19XXXA Unspecified fall, initial encounter: Secondary | ICD-10-CM | POA: Diagnosis present

## 2016-01-28 DIAGNOSIS — I4891 Unspecified atrial fibrillation: Secondary | ICD-10-CM | POA: Diagnosis present

## 2016-01-28 DIAGNOSIS — R6520 Severe sepsis without septic shock: Secondary | ICD-10-CM | POA: Diagnosis present

## 2016-01-28 DIAGNOSIS — A409 Streptococcal sepsis, unspecified: Secondary | ICD-10-CM | POA: Diagnosis present

## 2016-01-28 DIAGNOSIS — R0602 Shortness of breath: Secondary | ICD-10-CM

## 2016-01-28 DIAGNOSIS — M1990 Unspecified osteoarthritis, unspecified site: Secondary | ICD-10-CM | POA: Diagnosis present

## 2016-01-28 DIAGNOSIS — S51019A Laceration without foreign body of unspecified elbow, initial encounter: Secondary | ICD-10-CM | POA: Diagnosis present

## 2016-01-28 DIAGNOSIS — A419 Sepsis, unspecified organism: Secondary | ICD-10-CM | POA: Diagnosis present

## 2016-01-28 DIAGNOSIS — J9601 Acute respiratory failure with hypoxia: Secondary | ICD-10-CM | POA: Diagnosis present

## 2016-01-28 DIAGNOSIS — J181 Lobar pneumonia, unspecified organism: Secondary | ICD-10-CM | POA: Diagnosis present

## 2016-01-28 DIAGNOSIS — A403 Sepsis due to Streptococcus pneumoniae: Secondary | ICD-10-CM | POA: Diagnosis not present

## 2016-01-28 DIAGNOSIS — M5416 Radiculopathy, lumbar region: Secondary | ICD-10-CM | POA: Diagnosis present

## 2016-01-28 DIAGNOSIS — J1008 Influenza due to other identified influenza virus with other specified pneumonia: Secondary | ICD-10-CM | POA: Diagnosis present

## 2016-01-28 DIAGNOSIS — R9431 Abnormal electrocardiogram [ECG] [EKG]: Secondary | ICD-10-CM | POA: Diagnosis not present

## 2016-01-28 DIAGNOSIS — S51011A Laceration without foreign body of right elbow, initial encounter: Secondary | ICD-10-CM

## 2016-01-28 DIAGNOSIS — F05 Delirium due to known physiological condition: Secondary | ICD-10-CM | POA: Diagnosis present

## 2016-01-28 DIAGNOSIS — I340 Nonrheumatic mitral (valve) insufficiency: Secondary | ICD-10-CM | POA: Diagnosis present

## 2016-01-28 DIAGNOSIS — R262 Difficulty in walking, not elsewhere classified: Secondary | ICD-10-CM

## 2016-01-28 DIAGNOSIS — J45909 Unspecified asthma, uncomplicated: Secondary | ICD-10-CM | POA: Diagnosis present

## 2016-01-28 LAB — CBC WITH DIFFERENTIAL/PLATELET
BASOS ABS: 0 10*3/uL (ref 0.0–0.1)
BASOS PCT: 0 %
Basophils Absolute: 0 10*3/uL (ref 0.0–0.1)
Basophils Relative: 0 %
EOS ABS: 0 10*3/uL (ref 0.0–0.7)
EOS PCT: 0 %
Eosinophils Absolute: 0 10*3/uL (ref 0.0–0.7)
Eosinophils Relative: 0 %
HEMATOCRIT: 42.9 % (ref 36.0–46.0)
HEMATOCRIT: 42.3 % (ref 36.0–46.0)
Hemoglobin: 14.7 g/dL (ref 12.0–15.0)
Hemoglobin: 14.7 g/dL (ref 12.0–15.0)
LYMPHS ABS: 0.5 10*3/uL — AB (ref 0.7–4.0)
Lymphocytes Relative: 2 %
Lymphocytes Relative: 3 %
Lymphs Abs: 0.4 10*3/uL — ABNORMAL LOW (ref 0.7–4.0)
MCH: 32.3 pg (ref 26.0–34.0)
MCH: 32.7 pg (ref 26.0–34.0)
MCHC: 34.3 g/dL (ref 30.0–36.0)
MCHC: 34.8 g/dL (ref 30.0–36.0)
MCV: 94.3 fL (ref 78.0–100.0)
MCV: 94.2 fL (ref 78.0–100.0)
MONO ABS: 0.5 10*3/uL (ref 0.1–1.0)
Monocytes Absolute: 0.6 10*3/uL (ref 0.1–1.0)
Monocytes Relative: 4 %
Monocytes Relative: 3 %
NEUTROS ABS: 15.8 10*3/uL — AB (ref 1.7–7.7)
NEUTROS PCT: 94 %
Neutro Abs: 14.5 10*3/uL — ABNORMAL HIGH (ref 1.7–7.7)
Neutrophils Relative %: 94 %
PLATELETS: 142 10*3/uL — AB (ref 150–400)
PLATELETS: 139 10*3/uL — AB (ref 150–400)
RBC: 4.55 MIL/uL (ref 3.87–5.11)
RBC: 4.49 MIL/uL (ref 3.87–5.11)
RDW: 13.2 % (ref 11.5–15.5)
RDW: 13.2 % (ref 11.5–15.5)
WBC Morphology: INCREASED
WBC: 16.8 10*3/uL — ABNORMAL HIGH (ref 4.0–10.5)
WBC: 15.5 10*3/uL — AB (ref 4.0–10.5)

## 2016-01-28 LAB — COMPREHENSIVE METABOLIC PANEL
ALBUMIN: 3.3 g/dL — AB (ref 3.5–5.0)
ALT: 69 U/L — ABNORMAL HIGH (ref 14–54)
ALT: 64 U/L — ABNORMAL HIGH (ref 14–54)
AST: 76 U/L — AB (ref 15–41)
AST: 73 U/L — AB (ref 15–41)
Albumin: 3.1 g/dL — ABNORMAL LOW (ref 3.5–5.0)
Alkaline Phosphatase: 57 U/L (ref 38–126)
Alkaline Phosphatase: 54 U/L (ref 38–126)
Anion gap: 12 (ref 5–15)
Anion gap: 12 (ref 5–15)
BUN: 20 mg/dL (ref 6–20)
BUN: 20 mg/dL (ref 6–20)
CHLORIDE: 106 mmol/L (ref 101–111)
CHLORIDE: 103 mmol/L (ref 101–111)
CO2: 16 mmol/L — ABNORMAL LOW (ref 22–32)
CO2: 18 mmol/L — AB (ref 22–32)
CREATININE: 1.07 mg/dL — AB (ref 0.44–1.00)
Calcium: 8.5 mg/dL — ABNORMAL LOW (ref 8.9–10.3)
Calcium: 8.4 mg/dL — ABNORMAL LOW (ref 8.9–10.3)
Creatinine, Ser: 1.07 mg/dL — ABNORMAL HIGH (ref 0.44–1.00)
GFR calc Af Amer: 51 mL/min — ABNORMAL LOW (ref >60)
GFR calc non Af Amer: 44 mL/min — ABNORMAL LOW (ref >60)
GFR calc non Af Amer: 44 mL/min — ABNORMAL LOW (ref >60)
GFR, EST AFRICAN AMERICAN: 51 mL/min — AB (ref >60)
GLUCOSE: 143 mg/dL — AB (ref 65–99)
Glucose, Bld: 180 mg/dL — ABNORMAL HIGH (ref 65–99)
POTASSIUM: 4 mmol/L (ref 3.5–5.1)
Potassium: 3.9 mmol/L (ref 3.5–5.1)
SODIUM: 133 mmol/L — AB (ref 135–145)
Sodium: 134 mmol/L — ABNORMAL LOW (ref 135–145)
Total Bilirubin: 0.9 mg/dL (ref 0.3–1.2)
Total Bilirubin: 1 mg/dL (ref 0.3–1.2)
Total Protein: 6 g/dL — ABNORMAL LOW (ref 6.5–8.1)
Total Protein: 5.9 g/dL — ABNORMAL LOW (ref 6.5–8.1)

## 2016-01-28 LAB — I-STAT CHEM 8, ED
BUN: 31 mg/dL — AB (ref 6–20)
CALCIUM ION: 1.02 mmol/L — AB (ref 1.15–1.40)
Chloride: 105 mmol/L (ref 101–111)
Creatinine, Ser: 0.9 mg/dL (ref 0.44–1.00)
Glucose, Bld: 138 mg/dL — ABNORMAL HIGH (ref 65–99)
HCT: 46 % (ref 36.0–46.0)
Hemoglobin: 15.6 g/dL — ABNORMAL HIGH (ref 12.0–15.0)
Potassium: 5.3 mmol/L — ABNORMAL HIGH (ref 3.5–5.1)
Sodium: 135 mmol/L (ref 135–145)
TCO2: 23 mmol/L (ref 0–100)

## 2016-01-28 LAB — URINALYSIS, ROUTINE W REFLEX MICROSCOPIC
Bilirubin Urine: NEGATIVE
Glucose, UA: NEGATIVE mg/dL
Hgb urine dipstick: NEGATIVE
KETONES UR: NEGATIVE mg/dL
LEUKOCYTES UA: NEGATIVE
NITRITE: NEGATIVE
PH: 5 (ref 5.0–8.0)
PROTEIN: NEGATIVE mg/dL
Specific Gravity, Urine: 1.005 (ref 1.005–1.030)

## 2016-01-28 LAB — PROTIME-INR
INR: 1.24
PROTHROMBIN TIME: 15.7 s — AB (ref 11.4–15.2)

## 2016-01-28 LAB — APTT: APTT: 40 s — AB (ref 24–36)

## 2016-01-28 LAB — I-STAT TROPONIN, ED: Troponin i, poc: 0.01 ng/mL (ref 0.00–0.08)

## 2016-01-28 LAB — I-STAT CG4 LACTIC ACID, ED
LACTIC ACID, VENOUS: 2.08 mmol/L — AB (ref 0.5–1.9)
Lactic Acid, Venous: 2 mmol/L (ref 0.5–1.9)

## 2016-01-28 LAB — MAGNESIUM: MAGNESIUM: 2 mg/dL (ref 1.7–2.4)

## 2016-01-28 LAB — TSH: TSH: 0.416 u[IU]/mL (ref 0.350–4.500)

## 2016-01-28 LAB — PROCALCITONIN: Procalcitonin: 8.26 ng/mL

## 2016-01-28 MED ORDER — DEXTROSE 5 % IV SOLN
1.0000 g | INTRAVENOUS | Status: DC
  Filled 2016-01-28: qty 10

## 2016-01-28 MED ORDER — LIDOCAINE-EPINEPHRINE-TETRACAINE (LET) SOLUTION
3.0000 mL | Freq: Once | NASAL | Status: AC
  Administered 2016-01-28: 15:00:00 3 mL via TOPICAL
  Filled 2016-01-28: qty 3

## 2016-01-28 MED ORDER — MOMETASONE FURO-FORMOTEROL FUM 200-5 MCG/ACT IN AERO
2.0000 | INHALATION_SPRAY | Freq: Two times a day (BID) | RESPIRATORY_TRACT | Status: DC
  Filled 2016-01-28 (×2): qty 8.8

## 2016-01-28 MED ORDER — DEXTROSE 5 % IV SOLN
500.0000 mg | Freq: Once | INTRAVENOUS | Status: AC
  Administered 2016-01-28: 500 mg via INTRAVENOUS
  Filled 2016-01-28: qty 500

## 2016-01-28 MED ORDER — SODIUM CHLORIDE 0.9 % IV BOLUS (SEPSIS)
1000.0000 mL | Freq: Once | INTRAVENOUS | Status: AC
  Administered 2016-01-28: 1000 mL via INTRAVENOUS

## 2016-01-28 MED ORDER — IPRATROPIUM-ALBUTEROL 0.5-2.5 (3) MG/3ML IN SOLN
3.0000 mL | RESPIRATORY_TRACT | Status: AC | PRN
  Administered 2016-01-29: 3 mL via RESPIRATORY_TRACT
  Filled 2016-01-28: qty 3

## 2016-01-28 MED ORDER — FUROSEMIDE 10 MG/ML IJ SOLN
40.0000 mg | Freq: Once | INTRAMUSCULAR | Status: AC
  Administered 2016-01-28: 40 mg via INTRAVENOUS
  Filled 2016-01-28: qty 4

## 2016-01-28 MED ORDER — ACETAMINOPHEN 325 MG PO TABS: 650.0000 mg | ORAL_TABLET | Freq: Four times a day (QID) | ORAL | Status: DC | PRN

## 2016-01-28 MED ORDER — SODIUM CHLORIDE 0.9 % IV BOLUS (SEPSIS): 1000.0000 mL | Freq: Once | INTRAVENOUS | Status: DC

## 2016-01-28 MED ORDER — LIDOCAINE-EPINEPHRINE (PF) 2 %-1:200000 IJ SOLN
10.0000 mL | Freq: Once | INTRAMUSCULAR | Status: AC
  Administered 2016-01-28: 10 mL via INTRADERMAL
  Filled 2016-01-28: qty 20

## 2016-01-28 MED ORDER — HEPARIN SODIUM (PORCINE) 5000 UNIT/ML IJ SOLN
5000.0000 [IU] | Freq: Three times a day (TID) | INTRAMUSCULAR | Status: DC
  Filled 2016-01-28: qty 1

## 2016-01-28 MED ORDER — IPRATROPIUM-ALBUTEROL 0.5-2.5 (3) MG/3ML IN SOLN
3.0000 mL | Freq: Four times a day (QID) | RESPIRATORY_TRACT | Status: AC
  Administered 2016-01-29: 3 mL via RESPIRATORY_TRACT
  Filled 2016-01-28 (×2): qty 3

## 2016-01-28 MED ORDER — GUAIFENESIN ER 600 MG PO TB12
1200.0000 mg | ORAL_TABLET | Freq: Two times a day (BID) | ORAL | Status: DC
  Administered 2016-01-29 – 2016-02-01 (×8): 1200 mg via ORAL
  Filled 2016-01-28 (×8): qty 2

## 2016-01-28 MED ORDER — ONDANSETRON HCL 4 MG PO TABS: 4.0000 mg | ORAL_TABLET | Freq: Four times a day (QID) | ORAL | Status: DC | PRN

## 2016-01-28 MED ORDER — POLYETHYLENE GLYCOL 3350 17 G PO PACK: 17.0000 g | PACK | Freq: Every day | ORAL | Status: DC | PRN

## 2016-01-28 MED ORDER — DEXTROSE 5 % IV SOLN
1.0000 g | Freq: Once | INTRAVENOUS | Status: AC
  Administered 2016-01-28: 1 g via INTRAVENOUS
  Filled 2016-01-28: qty 10

## 2016-01-28 MED ORDER — ONDANSETRON HCL 4 MG/2ML IJ SOLN: 4.0000 mg | Freq: Four times a day (QID) | INTRAMUSCULAR | Status: DC | PRN

## 2016-01-28 MED ORDER — ACETAMINOPHEN 650 MG RE SUPP: 650.0000 mg | Freq: Four times a day (QID) | RECTAL | Status: DC | PRN

## 2016-01-28 NOTE — ED Notes (Signed)
Respiratory contacted to reassess need for CB.

## 2016-01-28 NOTE — ED Notes (Signed)
Patient transported to CT 

## 2016-01-28 NOTE — ED Notes (Signed)
Pt given 2 cups of water, graham crackers and a lunch bag. Pt continues to be in the low 90s on room air.

## 2016-01-28 NOTE — ED Notes (Signed)
Dr. Merrell at bedside. 

## 2016-01-28 NOTE — H&P (Signed)
History and Physical    Loretta Spikeleanor S Todd ZOX:[id number] DOB: 1921-07-06 DOA: 01/28/2016  PCP: Delorse LekBURNETT,BRENT A, MD Patient coming from: home  Chief Complaint: progressive weakness HPI: Loretta Todd is a 83 y.o. female with medical history significant of asthma, osteoarthritis, transient third degree AV block and chronic left bundle branch block with known ischemia who presented to the emergency room today with complaints of chills and weakness, shortness of breath worse with exertion and cough, lightheadedness. Patient reported that her symptoms started a couple of days ago were getting progressively worse  ED Course: In the ED patient was hypotensive on presentation with a pressure of 86/70 mmHg and tachycardic with heart rate reaching 148 bpm, she was tachypneic with respirations of 30/m and had elevated white blood cells count to 16,800 Chest x-ray showed right upper and lower lobe airspace disease suspicious for pneumonia Lactic acid and calcitonin levels are pending  Review of Systems: As per HPI otherwise 10 point review of systems negative.   Ambulatory Status: independent, [place]  Past Medical History:  Diagnosis Date  . Arthritis   . Asthma   . Lumbar radiculopathy   . Mitral late systolic murmur   . OA (osteoarthritis)    DIPs and PIPs  . Rotator cuff tendinitis     Past Surgical History:  Procedure Laterality Date  . EAR CYST EXCISION Left 06/20/2013   Procedure: DEBRIDE MUCOID CYST/REMOVE LOOSE BODIES LEFT RING FINGER ;  Surgeon: Wyn Forsterobert Sypher Jr V, MD;  Location: [facility];  Service: Orthopedics;  Laterality: Left;  . EYE SURGERY     both cataracts  . KNEE ARTHROSCOPY W/ MENISCAL REPAIR  2010   right  . TONSILLECTOMY      Social History   Social History  . Marital status: Widowed    Spouse name: N/A  . Number of children: N/A  . Years of education: N/A   Occupational History  . Not on file.   Social History  Main Topics  . Smoking status: Former Smoker    Quit date: 06/14/1996  . Smokeless tobacco: Never Used  . Alcohol use Yes     Comment: daily scotch  . Drug use: No  . Sexual activity: Not on file   Other Topics Concern  . Not on file   Social History Narrative  . No narrative on file    No Known Allergies   History reviewed. No pertinent family history.  Prior to Admission medications   Medication Sig Start Date End Date Taking? Authorizing Provider  Fluticasone-Salmeterol (ADVAIR) 250-50 MCG/DOSE AEPB Inhale 1 puff into the lungs 2 (two) times daily.    Yes Historical Provider, MD  ibuprofen (ADVIL,MOTRIN) 200 MG tablet Take 200 mg by mouth 2 (two) times daily.   Yes Historical Provider, MD    Physical Exam: Vitals:   01/28/16 1515 01/28/16 1530 01/28/16 1545 01/28/16 1600  BP: 121/65 122/79 114/69 114/69  Pulse: (!) 31 93 96 96  Resp: 26 25 (!) 28 24  Temp:      TempSrc:      SpO2: 94% 95% 95% 94%     General: Appears calm and comfortable Eyes: PERRLA, EOMI, normal lids, iris ENT:  grossly normal hearing, lips & tongue, mucous membranes moist and intact Neck: no lymphoadenopathy, masses or thyromegaly Cardiovascular: RRR, no m/r/g. No JVD, carotid bruits. No LE edema.  Respiratory: bilateral coarse rhonchious breath sound. Normal respiratory effort. No accessory muscle use observed Abdomen: soft,  non-tender, non-distended, no organomegaly or masses appreciated. BS present in all quadrants Skin: no rash, ulcers or induration seen on limited exam, right elbow laceration Musculoskeletal: grossly normal tone BUE/BLE, good ROM, no bony abnormality or joint deformities observed Psychiatric: grossly normal mood and affect, speech fluent and appropriate, alert and oriented x3 Neurologic: CN II-XII grossly intact, moves all extremities in coordinated fashion, sensation intact  Labs on Admission: I have personally reviewed following labs and imaging studies  CBC,  BMP  GFR: CrCl cannot be calculated (Unknown ideal weight.).   Creatinine Clearance: CrCl cannot be calculated (Unknown ideal weight.).    Radiological Exams on Admission: Dg Chest 2 View  Result Date: 01/28/2016 CLINICAL DATA:  Cough and flu like symptoms since Sunday. Very week. EXAM: CHEST  2 VIEW COMPARISON:  None. FINDINGS: The heart is upper limits of normal in size. Emphysematous changes are present. Right upper and lower lobe superimposed airspace disease is present. No focal airspace disease is present on the left. The visualized soft tissues and bony thorax are unremarkable. IMPRESSION: 1. Right upper and lower lobe airspace disease concerning for pneumonia. 2. Emphysema. Electronically Signed   By: Marin Roberts M.D.   On: 01/28/2016 12:59   Ct Head Wo Contrast  Result Date: 01/28/2016 CLINICAL DATA:  Patient status post fall. No reported loss consciousness. EXAM: CT HEAD WITHOUT CONTRAST TECHNIQUE: Contiguous axial images were obtained from the base of the skull through the vertex without intravenous contrast. COMPARISON:  None. FINDINGS: Brain: Ventricles and sulci are prominent compatible with atrophy. Periventricular and subcortical white matter hypodensity compatible with chronic microvascular ischemic changes. Old bilateral basal ganglia and left thalamus lacunar infarcts. No acute cortically based infarct, intracranial hemorrhage, mass lesion or mass-effect. Vascular: Internal carotid arterial vascular calcifications. Skull: Intact. Sinuses/Orbits: Paranasal sinuses are well aerated. Orbits are unremarkable. Other: None. IMPRESSION: No acute intracranial process. Chronic microvascular ischemic changes and cortical atrophy. Old bilateral basal ganglia lacunar infarcts. Electronically Signed   By: Annia Belt M.D.   On: 01/28/2016 13:39    EKG: Independently reviewed - atrial fibrillation with bundle branch block, chronic  Assessment/Plan Principal Problem:   Severe  sepsis (HCC) Active Problems:   LBBB (left bundle branch block)   CAP (community acquired pneumonia)   Atrial fibrillation (HCC)   Severe sepsis associated with community acquired pneumonia Patient was admitted on sepsis protocol  Continue Zithromax and Rocephin IV Nebulizers treatments, guaifenesin as needed Continue follow-up white blood cells count and inflammatory  New-onset atrial fibrillation in patient with previous history of high degree AV block and chronic left bundle branch block Cycle cardiac enzymes, telemetry monitoring, Echocardiogram Patient was seen by Dr. Mayford Knife in 2015, Had a negative stress test in 2004 We'll check TSH and magnesium level Consider cardiology consult tomorrow    DVT prophylaxis:  heparin  Code Status:  full  Family Communication:  at bedside  Disposition Plan:  telemetry  Consults called: none Admission status: Inpatient   Raymon Mutton, [place] Pager: [phone] [facility]  If 7PM-7AM, please contact night-coverage [email or web address] Password [facility]  01/28/2016, 4:35 PM

## 2016-01-28 NOTE — ED Notes (Signed)
Phlebotomy called to draw blood cultures

## 2016-01-28 NOTE — ED Notes (Addendum)
Oxygen increased to 3 LPM [place] to maintain Sp02 above 94%.

## 2016-01-28 NOTE — ED Notes (Signed)
Phlebotomy at bedside.

## 2016-01-28 NOTE — Progress Notes (Signed)
Pt is off NIV at this time , titrated to a 2L [place] tolerating it well no distress noted,.

## 2016-01-28 NOTE — ED Triage Notes (Signed)
Pt arrives from home via [facility]. Pt states she has been extremely fatigued and has had some exertional SOB. Pt was only 90% on RA, is currently 95% on 2L [place]. Pt also fell over her dog and has bilateral skin tears to the forearms. Pt daughter states she had the flu a week ago and thinks she passed it to her mother.

## 2016-01-28 NOTE — Progress Notes (Signed)
Pt is on NIV at this time tolerating it well.  

## 2016-01-28 NOTE — ED Notes (Signed)
Xray called for a STAT portable CXR. Pt was clear in all lobes upon arrival and is now having increased tachypnea and coarse crackles that are audible without a stethoscope. Dr. Konrad DoloresMerrell informed.

## 2016-01-28 NOTE — ED Provider Notes (Signed)
[facility]-EMER[facility]ENCY DEPT Provider Note   CSN: [id number] Arrival date & time: 01/28/16  1155     History   Chief Complaint Chief Complaint  Patient presents with  . Fatigue    HPI Loretta Todd is a 83 y.o. female.  83 yo F with a cc of fatigue.  Started on Sunday with chills, cough.  Having some sob, worse on exertion with decreased energy level.  Fell yesterday and was unable to get up.  Having some subjective fevers and chills.  Some lightheadedness upon standing.  Denies urinary complaint.  Denies diarrhea.    The history is provided by the patient.  Illness  This is a new problem. The current episode started more than 2 days ago. The problem occurs constantly. The problem has not changed since onset.Associated symptoms include shortness of breath. Pertinent negatives include no chest pain and no headaches. Nothing aggravates the symptoms. Nothing relieves the symptoms. She has tried nothing for the symptoms. The treatment provided no relief.    Past Medical History:  Diagnosis Date  . Arthritis   . Asthma   . Lumbar radiculopathy   . Mitral late systolic murmur   . OA (osteoarthritis)    DIPs and PIPs  . Rotator cuff tendinitis     Patient Active Problem List   Diagnosis Date Noted  . CAP (community acquired pneumonia) 01/28/2016  . Severe sepsis (HCC) 01/28/2016  . Bradycardia 06/27/2013  . LBBB (left bundle branch block) 06/27/2013  . AV block, 3rd degree (HCC) 06/27/2013    Past Surgical History:  Procedure Laterality Date  . EAR CYST EXCISION Left 06/20/2013   Procedure: DEBRIDE MUCOID CYST/REMOVE LOOSE BODIES LEFT RING FINGER ;  Surgeon: Wyn Forster, MD;  Location: [facility];  Service: Orthopedics;  Laterality: Left;  . EYE SURGERY     both cataracts  . KNEE ARTHROSCOPY W/ MENISCAL REPAIR  2010   right  . TONSILLECTOMY      OB History    No data available       Home Medications    Prior to Admission medications     Medication Sig Start Date End Date Taking? Authorizing Provider  Fluticasone-Salmeterol (ADVAIR) 250-50 MCG/DOSE AEPB Inhale 1 puff into the lungs 2 (two) times daily.    Yes Historical Provider, MD  ibuprofen (ADVIL,MOTRIN) 200 MG tablet Take 200 mg by mouth 2 (two) times daily.   Yes Historical Provider, MD    Family History History reviewed. No pertinent family history.  Social History Social History  Substance Use Topics  . Smoking status: Former Smoker    Quit date: 06/14/1996  . Smokeless tobacco: Never Used  . Alcohol use Yes     Comment: daily scotch     Allergies   Patient has no known allergies.   Review of Systems Review of Systems  Constitutional: Positive for fever (subjective). Negative for chills.  HENT: Positive for congestion. Negative for rhinorrhea.   Eyes: Negative for redness and visual disturbance.  Respiratory: Positive for cough and shortness of breath. Negative for wheezing.   Cardiovascular: Negative for chest pain and palpitations.  Gastrointestinal: Negative for nausea and vomiting.  Genitourinary: Negative for dysuria and urgency.  Musculoskeletal: Negative for arthralgias and myalgias.  Skin: Negative for pallor and wound.  Neurological: Positive for weakness (generalized). Negative for dizziness and headaches.     Physical Exam Updated Vital Signs BP 114/69   Pulse 96   Temp 97.7 F (36.5 C) (Oral)  Resp 24   SpO2 94%   Physical Exam  Constitutional: She is oriented to person, place, and time. She appears well-developed and well-nourished. No distress.  HENT:  Head: Normocephalic and atraumatic.  Eyes: EOM are normal. Pupils are equal, round, and reactive to light.  Neck: Normal range of motion. Neck supple.  Cardiovascular: Normal rate and regular rhythm.  Exam reveals no gallop and no friction rub.   No murmur heard. Pulmonary/Chest: Effort normal. She has no wheezes. She has no rales.  Abdominal: Soft. She exhibits no  distension and no mass. There is no tenderness. There is no guarding.  Musculoskeletal: She exhibits no edema or tenderness.  4cm deep skin tear to right elbow.  1cm skin tear to left forearm. No noted pain with ROM.  No midline C spine tenderness, ROM intact to the neck.  No signs of injury to the head.  Palpated from head to toe with no other noted areas of bony TTP.   Neurological: She is alert and oriented to person, place, and time.  Skin: Skin is warm and dry. She is not diaphoretic.  Psychiatric: She has a normal mood and affect. Her behavior is normal.  Nursing note and vitals reviewed.    ED Treatments / Results  Labs (all labs ordered are listed, but only abnormal results are displayed) Labs Reviewed  CBC WITH DIFFERENTIAL/PLATELET - Abnormal; Notable for the following:       Result Value   WBC 16.8 (*)    Platelets 142 (*)    Neutro Abs 15.8 (*)    Lymphs Abs 0.4 (*)    All other components within normal limits  COMPREHENSIVE METABOLIC PANEL - Abnormal; Notable for the following:    Sodium 134 (*)    CO2 16 (*)    Glucose, Bld 143 (*)    Creatinine, Ser 1.07 (*)    Calcium 8.5 (*)    Total Protein 6.0 (*)    Albumin 3.3 (*)    AST 76 (*)    ALT 69 (*)    GFR calc non Af Amer 44 (*)    GFR calc Af Amer 51 (*)    All other components within normal limits  I-STAT CHEM 8, ED - Abnormal; Notable for the following:    Potassium 5.3 (*)    BUN 31 (*)    Glucose, Bld 138 (*)    Calcium, Ion 1.02 (*)    Hemoglobin 15.6 (*)    All other components within normal limits  CULTURE, BLOOD (ROUTINE X 2)  CULTURE, BLOOD (ROUTINE X 2)  CULTURE, BLOOD (ROUTINE X 2)  CULTURE, BLOOD (ROUTINE X 2)  CULTURE, EXPECTORATED SPUTUM-ASSESSMENT  CULTURE, BLOOD (ROUTINE X 2)  CULTURE, BLOOD (ROUTINE X 2)  CULTURE, EXPECTORATED SPUTUM-ASSESSMENT  GRAM STAIN  URINALYSIS, ROUTINE W REFLEX MICROSCOPIC  COMPREHENSIVE METABOLIC PANEL  PROCALCITONIN  PROTIME-INR  APTT  CBC  CBC WITH  DIFFERENTIAL/PLATELET  HIV ANTIBODY (ROUTINE TESTING)  STREP PNEUMONIAE URINARY ANTIGEN  I-STAT TROPOININ, ED  I-STAT CG4 LACTIC ACID, ED    EKG  EKG Interpretation  Date/Time:  Tuesday January 28 2016 13:12:16 EST Ventricular Rate:  95 PR Interval:    QRS Duration: 134 QT Interval:  414 QTC Calculation: 520 R Axis:   -70 Text Interpretation:  Atrial fibrillation Left axis deviation Non-specific intra-ventricular conduction block Cannot rule out Anteroseptal infarct , age undetermined T wave abnormality, consider lateral ischemia Abnormal ECG No significant change since last tracing Confirmed by Shamia Uppal MD, DANIEL (  [id number]) on 01/28/2016 2:05:30 PM       Radiology Dg Chest 2 View  Result Date: 01/28/2016 CLINICAL DATA:  Cough and flu like symptoms since Sunday. Very week. EXAM: CHEST  2 VIEW COMPARISON:  None. FINDINGS: The heart is upper limits of normal in size. Emphysematous changes are present. Right upper and lower lobe superimposed airspace disease is present. No focal airspace disease is present on the left. The visualized soft tissues and bony thorax are unremarkable. IMPRESSION: 1. Right upper and lower lobe airspace disease concerning for pneumonia. 2. Emphysema. Electronically Signed   By: Marin Roberts M.D.   On: 01/28/2016 12:59   Ct Head Wo Contrast  Result Date: 01/28/2016 CLINICAL DATA:  Patient status post fall. No reported loss consciousness. EXAM: CT HEAD WITHOUT CONTRAST TECHNIQUE: Contiguous axial images were obtained from the base of the skull through the vertex without intravenous contrast. COMPARISON:  None. FINDINGS: Brain: Ventricles and sulci are prominent compatible with atrophy. Periventricular and subcortical white matter hypodensity compatible with chronic microvascular ischemic changes. Old bilateral basal ganglia and left thalamus lacunar infarcts. No acute cortically based infarct, intracranial hemorrhage, mass lesion or mass-effect. Vascular: Internal  carotid arterial vascular calcifications. Skull: Intact. Sinuses/Orbits: Paranasal sinuses are well aerated. Orbits are unremarkable. Other: None. IMPRESSION: No acute intracranial process. Chronic microvascular ischemic changes and cortical atrophy. Old bilateral basal ganglia lacunar infarcts. Electronically Signed   By: Annia Belt M.D.   On: 01/28/2016 13:39    Procedures .Marland KitchenLaceration Repair Date/Time: 01/28/2016 4:20 PM Performed by: Adela Lank Audreyanna Butkiewicz Authorized by: Melene Plan   Consent:    Consent obtained:  Verbal   Consent given by:  Patient   Risks discussed:  Infection, pain and poor cosmetic result   Alternatives discussed:  No treatment and delayed treatment Anesthesia (see MAR for exact dosages):    Anesthesia method:  Topical application and local infiltration   Topical anesthetic:  LET   Local anesthetic:  Lidocaine 1% WITH epi Laceration details:    Location:  Shoulder/arm   Shoulder/arm location:  R elbow   Length (cm):  4 Repair type:    Repair type:  Intermediate Exploration:    Wound exploration: wound explored through full range of motion     Contaminated: no   Treatment:    Wound cleansed with: chlorahexidene.   Amount of cleaning:  Standard   Irrigation solution:  Sterile saline   Irrigation volume:  30   Irrigation method:  Pressure wash   Visualized foreign bodies/material removed: no   Skin repair:    Repair method:  Sutures   Suture size:  4-0   Suture material:  Nylon   Suture technique:  Horizontal mattress   Number of sutures:  6 Approximation:    Approximation:  Close Post-procedure details:    Dressing:  Antibiotic ointment and bulky dressing   Patient tolerance of procedure:  Tolerated well, no immediate complications   (including critical care time)  Medications Ordered in ED Medications  azithromycin (ZITHROMAX) 500 mg in dextrose 5 % 250 mL IVPB (500 mg Intravenous New Bag/Given 01/28/16 1607)  cefTRIAXone (ROCEPHIN) 1 g in dextrose 5 % 50  mL IVPB (not administered)  mometasone-formoterol (DULERA) 200-5 MCG/ACT inhaler 2 puff (not administered)  heparin injection 5,000 Units (not administered)  acetaminophen (TYLENOL) tablet 650 mg (not administered)    Or  acetaminophen (TYLENOL) suppository 650 mg (not administered)  polyethylene glycol (MIRALAX / GLYCOLAX) packet 17 g (not administered)  ondansetron (ZOFRAN) tablet 4 mg (  not administered)    Or  ondansetron (ZOFRAN) injection 4 mg (not administered)  sodium chloride 0.9 % bolus 1,000 mL (1,000 mLs Intravenous New Bag/Given 01/28/16 1607)    And  sodium chloride 0.9 % bolus 1,000 mL (not administered)  sodium chloride 0.9 % bolus 1,000 mL (1,000 mLs Intravenous New Bag/Given 01/28/16 1451)  lidocaine-EPINEPHrine (XYLOCAINE W/EPI) 2 %-1:200000 (PF) injection 10 mL (10 mLs Intradermal Given by Other 01/28/16 1453)  cefTRIAXone (ROCEPHIN) 1 g in dextrose 5 % 50 mL IVPB (0 g Intravenous Stopped 01/28/16 1522)  lidocaine-EPINEPHrine-tetracaine (LET) solution (3 mLs Topical Given by Other 01/28/16 1452)     Initial Impression / Assessment and Plan / ED Course  I have reviewed the triage vital signs and the nursing notes.  Pertinent labs & imaging results that were available during my care of the patient were reviewed by me and considered in my medical decision making (see chart for details).     83 yo F With a chief complaints of cough and subjective fevers and weakness. Going on for the past 3 days. Patient fell yesterday and was unable to get up and this concerned her. Initial oxygen saturation was 90 at rest on room air. Patient states that she feels fine currently. We'll give a fluid bolus. CT head chest x-ray troponin CBC BMP.  Found to have right upper lobe pneumonia on chest x-ray. Give Rocephin and azithromycin. Attempt to ambulate.  Unable to ambulate, patient BP transiently worse, not symptomatic though with SBP <90 will call code sepsis, given 2nd L of fluids up to  30cc/kg. Lactic ordered.     The patients results and plan were reviewed and discussed.   Any x-rays performed were independently reviewed by myself.   Differential diagnosis were considered with the presenting HPI.  Medications  azithromycin (ZITHROMAX) 500 mg in dextrose 5 % 250 mL IVPB (500 mg Intravenous New Bag/Given 01/28/16 1607)  cefTRIAXone (ROCEPHIN) 1 g in dextrose 5 % 50 mL IVPB (not administered)  mometasone-formoterol (DULERA) 200-5 MCG/ACT inhaler 2 puff (not administered)  heparin injection 5,000 Units (not administered)  acetaminophen (TYLENOL) tablet 650 mg (not administered)    Or  acetaminophen (TYLENOL) suppository 650 mg (not administered)  polyethylene glycol (MIRALAX / GLYCOLAX) packet 17 g (not administered)  ondansetron (ZOFRAN) tablet 4 mg (not administered)    Or  ondansetron (ZOFRAN) injection 4 mg (not administered)  sodium chloride 0.9 % bolus 1,000 mL (1,000 mLs Intravenous New Bag/Given 01/28/16 1607)    And  sodium chloride 0.9 % bolus 1,000 mL (not administered)  sodium chloride 0.9 % bolus 1,000 mL (1,000 mLs Intravenous New Bag/Given 01/28/16 1451)  lidocaine-EPINEPHrine (XYLOCAINE W/EPI) 2 %-1:200000 (PF) injection 10 mL (10 mLs Intradermal Given by Other 01/28/16 1453)  cefTRIAXone (ROCEPHIN) 1 g in dextrose 5 % 50 mL IVPB (0 g Intravenous Stopped 01/28/16 1522)  lidocaine-EPINEPHrine-tetracaine (LET) solution (3 mLs Topical Given by Other 01/28/16 1452)    Vitals:   01/28/16 1515 01/28/16 1530 01/28/16 1545 01/28/16 1600  BP: 121/65 122/79 114/69 114/69  Pulse: (!) 31 93 96 96  Resp: 26 25 (!) 28 24  Temp:      TempSrc:      SpO2: 94% 95% 95% 94%    Final diagnoses:  Community acquired pneumonia of right upper lobe of lung (HCC)  Elbow laceration, right, initial encounter    Admission/ observation were discussed with the admitting physician, patient and/or family and they are comfortable with the plan.  Final Clinical Impressions(s) /  ED Diagnoses   Final diagnoses:  Community acquired pneumonia of right upper lobe of lung (HCC)  Elbow laceration, right, initial encounter    New Prescriptions New Prescriptions   No medications on file     Melene Plan, DO 01/28/16 1621

## 2016-01-28 NOTE — ED Notes (Signed)
EKG given to Dr. Floyd 

## 2016-01-29 ENCOUNTER — Inpatient Hospital Stay (HOSPITAL_COMMUNITY): Payer: Medicare Other

## 2016-01-29 DIAGNOSIS — I480 Paroxysmal atrial fibrillation: Secondary | ICD-10-CM

## 2016-01-29 DIAGNOSIS — J9601 Acute respiratory failure with hypoxia: Secondary | ICD-10-CM

## 2016-01-29 DIAGNOSIS — A403 Sepsis due to Streptococcus pneumoniae: Secondary | ICD-10-CM

## 2016-01-29 DIAGNOSIS — J181 Lobar pneumonia, unspecified organism: Secondary | ICD-10-CM

## 2016-01-29 LAB — BLOOD CULTURE ID PANEL (REFLEXED)
Acinetobacter baumannii: NOT DETECTED
CANDIDA GLABRATA: NOT DETECTED
CANDIDA KRUSEI: NOT DETECTED
Candida albicans: NOT DETECTED
Candida parapsilosis: NOT DETECTED
Candida tropicalis: NOT DETECTED
ENTEROBACTER CLOACAE COMPLEX: NOT DETECTED
ESCHERICHIA COLI: NOT DETECTED
Enterobacteriaceae species: NOT DETECTED
Enterococcus species: NOT DETECTED
Haemophilus influenzae: NOT DETECTED
Klebsiella oxytoca: NOT DETECTED
Klebsiella pneumoniae: NOT DETECTED
Listeria monocytogenes: NOT DETECTED
Neisseria meningitidis: NOT DETECTED
PROTEUS SPECIES: NOT DETECTED
PSEUDOMONAS AERUGINOSA: NOT DETECTED
STAPHYLOCOCCUS SPECIES: NOT DETECTED
STREPTOCOCCUS PNEUMONIAE: NOT DETECTED
STREPTOCOCCUS PYOGENES: DETECTED — AB
Serratia marcescens: NOT DETECTED
Staphylococcus aureus (BCID): NOT DETECTED
Streptococcus agalactiae: NOT DETECTED
Streptococcus species: DETECTED — AB

## 2016-01-29 LAB — RESPIRATORY PANEL BY PCR
ADENOVIRUS-RVPPCR: NOT DETECTED
BORDETELLA PERTUSSIS-RVPCR: NOT DETECTED
CORONAVIRUS 229E-RVPPCR: NOT DETECTED
CORONAVIRUS HKU1-RVPPCR: NOT DETECTED
CORONAVIRUS NL63-RVPPCR: NOT DETECTED
CORONAVIRUS OC43-RVPPCR: NOT DETECTED
Chlamydophila pneumoniae: NOT DETECTED
Influenza A H3: DETECTED — AB
Influenza B: NOT DETECTED
METAPNEUMOVIRUS-RVPPCR: NOT DETECTED
Mycoplasma pneumoniae: NOT DETECTED
PARAINFLUENZA VIRUS 1-RVPPCR: NOT DETECTED
PARAINFLUENZA VIRUS 2-RVPPCR: NOT DETECTED
Parainfluenza Virus 3: NOT DETECTED
Parainfluenza Virus 4: NOT DETECTED
Respiratory Syncytial Virus: NOT DETECTED
Rhinovirus / Enterovirus: NOT DETECTED

## 2016-01-29 LAB — BASIC METABOLIC PANEL
Anion gap: 11 (ref 5–15)
BUN: 19 mg/dL (ref 6–20)
CALCIUM: 8.2 mg/dL — AB (ref 8.9–10.3)
CO2: 22 mmol/L (ref 22–32)
CREATININE: 1.01 mg/dL — AB (ref 0.44–1.00)
Chloride: 103 mmol/L (ref 101–111)
GFR, EST AFRICAN AMERICAN: 54 mL/min — AB (ref >60)
GFR, EST NON AFRICAN AMERICAN: 47 mL/min — AB (ref >60)
Glucose, Bld: 110 mg/dL — ABNORMAL HIGH (ref 65–99)
Potassium: 3.6 mmol/L (ref 3.5–5.1)
SODIUM: 136 mmol/L (ref 135–145)

## 2016-01-29 LAB — CBC
HCT: 42.8 % (ref 36.0–46.0)
Hemoglobin: 14.5 g/dL (ref 12.0–15.0)
MCH: 32.2 pg (ref 26.0–34.0)
MCHC: 33.9 g/dL (ref 30.0–36.0)
MCV: 95.1 fL (ref 78.0–100.0)
PLATELETS: 143 10*3/uL — AB (ref 150–400)
RBC: 4.5 MIL/uL (ref 3.87–5.11)
RDW: 13.5 % (ref 11.5–15.5)
WBC: 17.9 10*3/uL — AB (ref 4.0–10.5)

## 2016-01-29 LAB — MRSA PCR SCREENING: MRSA BY PCR: NEGATIVE

## 2016-01-29 LAB — STREP PNEUMONIAE URINARY ANTIGEN: STREP PNEUMO URINARY ANTIGEN: NEGATIVE

## 2016-01-29 LAB — HIV ANTIBODY (ROUTINE TESTING W REFLEX): HIV Screen 4th Generation wRfx: NONREACTIVE

## 2016-01-29 MED ORDER — DILTIAZEM HCL 25 MG/5ML IV SOLN
10.0000 mg | Freq: Four times a day (QID) | INTRAVENOUS | Status: DC | PRN
  Administered 2016-01-29: 10 mg via INTRAVENOUS
  Filled 2016-01-29 (×4): qty 5

## 2016-01-29 MED ORDER — LORAZEPAM 1 MG PO TABS
1.0000 mg | ORAL_TABLET | Freq: Once | ORAL | Status: AC
  Administered 2016-01-29: 1 mg via ORAL
  Filled 2016-01-29: qty 1

## 2016-01-29 MED ORDER — DILTIAZEM HCL 25 MG/5ML IV SOLN
10.0000 mg | Freq: Once | INTRAVENOUS | Status: AC
  Administered 2016-01-29: 10 mg via INTRAVENOUS
  Filled 2016-01-29: qty 5

## 2016-01-29 MED ORDER — ENOXAPARIN SODIUM 40 MG/0.4ML ~~LOC~~ SOLN
40.0000 mg | SUBCUTANEOUS | Status: DC
  Administered 2016-01-30 – 2016-01-31 (×2): 40 mg via SUBCUTANEOUS
  Filled 2016-01-29 (×2): qty 0.4

## 2016-01-29 MED ORDER — DEXTROSE 5 % IV SOLN
2.0000 g | INTRAVENOUS | Status: DC
  Administered 2016-01-29 – 2016-02-01 (×4): 2 g via INTRAVENOUS
  Filled 2016-01-29 (×4): qty 2

## 2016-01-29 MED ORDER — MORPHINE SULFATE (PF) 2 MG/ML IV SOLN
2.0000 mg | Freq: Once | INTRAVENOUS | Status: AC
  Administered 2016-01-29: 2 mg via INTRAVENOUS
  Filled 2016-01-29: qty 1

## 2016-01-29 MED ORDER — OSELTAMIVIR PHOSPHATE 30 MG PO CAPS
30.0000 mg | ORAL_CAPSULE | Freq: Two times a day (BID) | ORAL | Status: DC
  Administered 2016-01-29 – 2016-02-01 (×6): 30 mg via ORAL
  Filled 2016-01-29 (×6): qty 1

## 2016-01-29 MED ORDER — ZOLPIDEM TARTRATE 5 MG PO TABS: 5.0000 mg | ORAL_TABLET | Freq: Every evening | ORAL | Status: DC | PRN

## 2016-01-29 MED ORDER — ORAL CARE MOUTH RINSE
15.0000 mL | Freq: Two times a day (BID) | OROMUCOSAL | Status: DC
  Administered 2016-01-29 – 2016-02-01 (×4): 15 mL via OROMUCOSAL

## 2016-01-29 MED ORDER — HYDRALAZINE HCL 20 MG/ML IJ SOLN
10.0000 mg | Freq: Once | INTRAMUSCULAR | Status: AC
  Administered 2016-01-29: 10 mg via INTRAVENOUS
  Filled 2016-01-29: qty 1

## 2016-01-29 NOTE — ED Notes (Signed)
Pt and family wish to withhold heparin until they can discuss medication with MD.

## 2016-01-29 NOTE — Progress Notes (Signed)
PHARMACY - PHYSICIAN COMMUNICATION CRITICAL VALUE ALERT - BLOOD CULTURE IDENTIFICATION (BCID)  Results for orders placed or performed during the hospital encounter of 01/28/16  Blood Culture ID Panel (Reflexed) (Collected: 01/28/2016  2:36 PM)  Result Value Ref Range   Enterococcus species NOT DETECTED NOT DETECTED   Listeria monocytogenes NOT DETECTED NOT DETECTED   Staphylococcus species NOT DETECTED NOT DETECTED   Staphylococcus aureus NOT DETECTED NOT DETECTED   Streptococcus species DETECTED (A) NOT DETECTED   Streptococcus agalactiae NOT DETECTED NOT DETECTED   Streptococcus pneumoniae NOT DETECTED NOT DETECTED   Streptococcus pyogenes DETECTED (A) NOT DETECTED   Acinetobacter baumannii NOT DETECTED NOT DETECTED   Enterobacteriaceae species NOT DETECTED NOT DETECTED   Enterobacter cloacae complex NOT DETECTED NOT DETECTED   Escherichia coli NOT DETECTED NOT DETECTED   Klebsiella oxytoca NOT DETECTED NOT DETECTED   Klebsiella pneumoniae NOT DETECTED NOT DETECTED   Proteus species NOT DETECTED NOT DETECTED   Serratia marcescens NOT DETECTED NOT DETECTED   Haemophilus influenzae NOT DETECTED NOT DETECTED   Neisseria meningitidis NOT DETECTED NOT DETECTED   Pseudomonas aeruginosa NOT DETECTED NOT DETECTED   Candida albicans NOT DETECTED NOT DETECTED   Candida glabrata NOT DETECTED NOT DETECTED   Candida krusei NOT DETECTED NOT DETECTED   Candida parapsilosis NOT DETECTED NOT DETECTED   Candida tropicalis NOT DETECTED NOT DETECTED    Name of physician (or Provider) Contacted: Dr. Gonzella Lexhungel  Changes to prescribed antibiotics required: Will increase Ceftriaxone to 2 grams IV every 24 hours to cover for bacteremia and PNA; however, if PNA not of concern would recommend deescalating to PenG IV for treatment of GAS bacteremia.   Pollyann SamplesAndy Rebakah Cokley, PharmD, BCPS 01/29/2016, 11:04 AM

## 2016-01-29 NOTE — Progress Notes (Signed)
Tamiflu 30mg  BID x5 days flu treatment dose adjusted for CrCl 30-7960ml/min  Leota SauersLisa Yomara Toothman Pharm.D. CPP, BCPS Clinical Pharmacist [phone] 01/29/2016 2:56 PM

## 2016-01-29 NOTE — Progress Notes (Signed)
[facility](Schorr, MD) paged and notified that patient HR still in 130s after 2mL of Cardizem. Patient still has increased work of breathing. Order to give additional dose of 2mL of Cardizem, 2mg  of Morphine, and get a chest xray was given. Will initiate orders and continue to monitor patient.

## 2016-01-29 NOTE — Progress Notes (Addendum)
PROGRESS NOTE                                                                                                                                                                                                             Patient Demographics:    Loretta Todd, is a 83 y.o. female, DOB - [birth year], WUJ:[id number]  Admit date - 01/28/2016   Admitting Physician Alba Cory, MD  Outpatient Primary MD for the patient is Delorse Lek, MD  LOS - 1  Outpatient Specialists: NONE  Chief Complaint  Patient presents with  . Fatigue       Brief Narrative   83 year old female with history of asthma, osteoarthritis, transient third-degree AV block (transient following finger surgery  in 2015 with no recurrence) chronic left bundle-branch block (negative for ischemia on stress test in 2004) who presented to the ED with chills with weakness, increasing shortness of breath worse with exertion and cough and lightheadedness for past few days. In the ED she was septic with hypotension, tachycardia and tachypnea with leukocytosis (WBC 17 K) chest x-ray showed multifocal pneumonia. Lactic acid was elevated. Admitted to stepdown unit for further management. Patient received IV fluid bolus for sepsis protocol in the ED following which she went into acute pulmonary edema. Improved after stopping fluids and given IV Lasix.   Subjective:   Breathing better today. Now has new-onset A. fib with heart rate up to 120s.   Assessment  & Plan :    Principal Problem:   Severe sepsis (HCC) Secondary to multilobar pneumonia. Blood culture on admission growing Streptococcus species. Will narrow antibiotic to Rocephin (also has prolonged QTC so will avoid macrolides). When necessary nebs. Respiratory viral panel pending. Sepsis improving. Supportive care with Tylenol and antitussives.   Active Problems: New onset atrial fibrillation Possibly  associated with sepsis. Check TSH. Added when necessary IV Cardizem. Although there is history of third-degree AV block this is likely transient in the PACU following removal of cyst of her finger. She did not have any further symptoms.  Acute respiratory failure with hypoxia (HCC) Secondary to lobar pneumonia. Plan as above. Follow respiratory viral panel.     LBBB (left bundle branch block) Chronic.  Prolonged QTC Check repeat EKG. Avoid QT prolonging agents.   Sundowning with confusion Will add bedtime ambien for sleep. Avoid Seroquel or  Haldol given prolonged QT.    Code Status : DO NOT RESUSCITATE  Family Communication  : Daughter at bedside  Disposition Plan  : Home pending clinical improvement, possibly in the next 48 hours  Barriers For Discharge : Active symptoms  Consults  : None  Procedures  : None  DVT Prophylaxis  :  Lovenox -   Lab Results  Component Value Date   PLT 143 (L) 01/29/2016    Antibiotics  :    Anti-infectives    Start     Dose/Rate Route Frequency Ordered Stop   01/29/16 1500  cefTRIAXone (ROCEPHIN) 1 g in dextrose 5 % 50 mL IVPB  Status:  Discontinued     1 g 100 mL/hr over 30 Minutes Intravenous Every 24 hours 01/28/16 1544 01/29/16 1059   01/29/16 1115  cefTRIAXone (ROCEPHIN) 2 g in dextrose 5 % 50 mL IVPB     2 g 100 mL/hr over 30 Minutes Intravenous Every 24 hours 01/29/16 1100     01/28/16 1345  cefTRIAXone (ROCEPHIN) 1 g in dextrose 5 % 50 mL IVPB     1 g 100 mL/hr over 30 Minutes Intravenous  Once 01/28/16 1336 01/28/16 1522   01/28/16 1345  azithromycin (ZITHROMAX) 500 mg in dextrose 5 % 250 mL IVPB     500 mg 250 mL/hr over 60 Minutes Intravenous  Once 01/28/16 1336 01/28/16 1707        Objective:   Vitals:   01/29/16 1000 01/29/16 1200 01/29/16 1243 01/29/16 1300  BP:   122/79   Pulse: (!) 108 (!) 112 94 (!) 115  Resp: (!) 31 (!) 25 20 (!) 26  Temp:   98.1 F (36.7 C)   TempSrc:   Oral   SpO2: 96% 96% 97% 95%    Weight:      Height:        Wt Readings from Last 3 Encounters:  01/29/16 62.5 kg (137 lb 12.6 oz)  08/02/13 65.7 kg (144 lb 12.8 oz)  06/27/13 65.3 kg (144 lb)     Intake/Output Summary (Last 24 hours) at 01/29/16 1350 Last data filed at 01/29/16 1202  Gross per 24 hour  Intake               50 ml  Output             1100 ml  Net            -1050 ml     Physical Exam  Gen: not in distress HEENT:  moist mucosa, supple neck Chest: Lateral rhonchi over right lung, no crackles CVS: S1 and S2 is irregularly irregular, no murmurs, rubs or gallop GI: soft, NT, ND, BS+ Musculoskeletal: warm, no edema CNS: Alert and oriented 2-3, nonfocal    Data Review:    CBC  Recent Labs Lab 01/28/16 1315 01/28/16 1344 01/28/16 1639 01/29/16 0210  WBC 16.8*  --  15.5* 17.9*  HGB 14.7 15.6* 14.7 14.5  HCT 42.9 46.0 42.3 42.8  PLT 142*  --  139* 143*  MCV 94.3  --  94.2 95.1  MCH 32.3  --  32.7 32.2  MCHC 34.3  --  34.8 33.9  RDW 13.2  --  13.2 13.5  LYMPHSABS 0.4*  --  0.5*  --   MONOABS 0.6  --  0.5  --   EOSABS 0.0  --  0.0  --   BASOSABS 0.0  --  0.0  --     Chemistries  Recent Labs Lab 01/28/16 1315 01/28/16 1344 01/28/16 1639 01/29/16 0210  NA 134* 135 133* 136  K 3.9 5.3* 4.0 3.6  CL 106 105 103 103  CO2 16*  --  18* 22  GLUCOSE 143* 138* 180* 110*  BUN 20 31* 20 19  CREATININE 1.07* 0.90 1.07* 1.01*  CALCIUM 8.5*  --  8.4* 8.2*  MG  --   --  2.0  --   AST 76*  --  73*  --   ALT 69*  --  64*  --   ALKPHOS 57  --  54  --   BILITOT 0.9  --  1.0  --    ------------------------------------------------------------------------------------------------------------------ No results for input(s): CHOL, HDL, LDLCALC, TRIG, CHOLHDL, LDLDIRECT in the last 72 hours.  No results found for: HGBA1C ------------------------------------------------------------------------------------------------------------------  Recent Labs  01/28/16 1639  TSH 0.416    ------------------------------------------------------------------------------------------------------------------ No results for input(s): VITAMINB12, FOLATE, FERRITIN, TIBC, IRON, RETICCTPCT in the last 72 hours.  Coagulation profile  Recent Labs Lab 01/28/16 1639  INR 1.24    No results for input(s): DDIMER in the last 72 hours.  Cardiac Enzymes No results for input(s): CKMB, TROPONINI, MYOGLOBIN in the last 168 hours.  Invalid input(s): CK ------------------------------------------------------------------------------------------------------------------ No results found for: BNP  Inpatient Medications  Scheduled Meds: . cefTRIAXone (ROCEPHIN)  IV  2 g Intravenous Q24H  . guaiFENesin  1,200 mg Oral BID  . heparin  5,000 Units Subcutaneous Q8H  . ipratropium-albuterol  3 mL Nebulization Q6H  . mouth rinse  15 mL Mouth Rinse BID  . mometasone-formoterol  2 puff Inhalation BID   Continuous Infusions: PRN Meds:.acetaminophen **OR** acetaminophen, diltiazem, ipratropium-albuterol **FOLLOWED BY** ipratropium-albuterol, ondansetron **OR** ondansetron (ZOFRAN) IV, polyethylene glycol, zolpidem  Micro Results Recent Results (from the past 240 hour(s))  Blood culture (routine x 2)     Status: None (Preliminary result)   Collection Time: 01/28/16  2:36 PM  Result Value Ref Range Status   Specimen Description BLOOD RIGHT ANTECUBITAL  Final   Special Requests BOTTLES DRAWN AEROBIC AND ANAEROBIC 5CC  Final   Culture  Setup Time   Final    GRAM POSITIVE COCCI IN CHAINS AEROBIC BOTTLE ONLY Organism ID to follow CRITICAL RESULT CALLED TO, READ BACK BY AND VERIFIED WITH: ADagoberto Ligas.D. 10:45 01/29/16 ([email or web address])    Culture GRAM POSITIVE COCCI  Final   Report Status PENDING  Incomplete  Blood Culture ID Panel (Reflexed)     Status: Abnormal   Collection Time: 01/28/16  2:36 PM  Result Value Ref Range Status   Enterococcus species NOT DETECTED NOT DETECTED Final   Listeria  monocytogenes NOT DETECTED NOT DETECTED Final   Staphylococcus species NOT DETECTED NOT DETECTED Final   Staphylococcus aureus NOT DETECTED NOT DETECTED Final   Streptococcus species DETECTED (A) NOT DETECTED Final    Comment: CRITICAL RESULT CALLED TO, READ BACK BY AND VERIFIED WITH: ADagoberto Ligas.D. 10:45 01/29/16 ([email or web address])    Streptococcus agalactiae NOT DETECTED NOT DETECTED Final   Streptococcus pneumoniae NOT DETECTED NOT DETECTED Final   Streptococcus pyogenes DETECTED (A) NOT DETECTED Final    Comment: CRITICAL RESULT CALLED TO, READ BACK BY AND VERIFIED WITH: ADagoberto Ligas.D. 10:45 01/29/16 ([email or web address])    Acinetobacter baumannii NOT DETECTED NOT DETECTED Final   Enterobacteriaceae species NOT DETECTED NOT DETECTED Final   Enterobacter cloacae complex NOT DETECTED NOT DETECTED Final   Escherichia coli NOT DETECTED NOT DETECTED Final   Klebsiella oxytoca NOT DETECTED NOT DETECTED Final  Klebsiella pneumoniae NOT DETECTED NOT DETECTED Final   Proteus species NOT DETECTED NOT DETECTED Final   Serratia marcescens NOT DETECTED NOT DETECTED Final   Haemophilus influenzae NOT DETECTED NOT DETECTED Final   Neisseria meningitidis NOT DETECTED NOT DETECTED Final   Pseudomonas aeruginosa NOT DETECTED NOT DETECTED Final   Candida albicans NOT DETECTED NOT DETECTED Final   Candida glabrata NOT DETECTED NOT DETECTED Final   Candida krusei NOT DETECTED NOT DETECTED Final   Candida parapsilosis NOT DETECTED NOT DETECTED Final   Candida tropicalis NOT DETECTED NOT DETECTED Final  MRSA PCR Screening     Status: None   Collection Time: 01/29/16 12:29 AM  Result Value Ref Range Status   MRSA by PCR NEGATIVE NEGATIVE Final    Comment:        The GeneXpert MRSA Assay (FDA approved for NASAL specimens only), is one component of a comprehensive MRSA colonization surveillance program. It is not intended to diagnose MRSA infection nor to guide or monitor treatment for MRSA infections.       Radiology Reports Dg Chest 2 View  Result Date: 01/28/2016 CLINICAL DATA:  Cough and flu like symptoms since Sunday. Very week. EXAM: CHEST  2 VIEW COMPARISON:  None. FINDINGS: The heart is upper limits of normal in size. Emphysematous changes are present. Right upper and lower lobe superimposed airspace disease is present. No focal airspace disease is present on the left. The visualized soft tissues and bony thorax are unremarkable. IMPRESSION: 1. Right upper and lower lobe airspace disease concerning for pneumonia. 2. Emphysema. Electronically Signed   By: Marin Robertshristopher  Mattern M.D.   On: 01/28/2016 12:59   Ct Head Wo Contrast  Result Date: 01/28/2016 CLINICAL DATA:  Patient status post fall. No reported loss consciousness. EXAM: CT HEAD WITHOUT CONTRAST TECHNIQUE: Contiguous axial images were obtained from the base of the skull through the vertex without intravenous contrast. COMPARISON:  None. FINDINGS: Brain: Ventricles and sulci are prominent compatible with atrophy. Periventricular and subcortical white matter hypodensity compatible with chronic microvascular ischemic changes. Old bilateral basal ganglia and left thalamus lacunar infarcts. No acute cortically based infarct, intracranial hemorrhage, mass lesion or mass-effect. Vascular: Internal carotid arterial vascular calcifications. Skull: Intact. Sinuses/Orbits: Paranasal sinuses are well aerated. Orbits are unremarkable. Other: None. IMPRESSION: No acute intracranial process. Chronic microvascular ischemic changes and cortical atrophy. Old bilateral basal ganglia lacunar infarcts. Electronically Signed   By: Annia Beltrew  Davis M.D.   On: 01/28/2016 13:39   Dg Chest Port 1 View  Result Date: 01/29/2016 CLINICAL DATA:  Acute respiratory failure EXAM: PORTABLE CHEST 1 VIEW COMPARISON:  01/28/2016 FINDINGS: Improving aeration throughout the left lung. Continued areas of consolidation in the right upper lobe and right lower lobe, slightly improved  since prior study. Mild cardiomegaly. No visible effusions. IMPRESSION: Persistent but improving areas of consolidation in the right lung concerning for pneumonia. Improved airspace disease throughout the left lung. Electronically Signed   By: Charlett NoseKevin  Dover M.D.   On: 01/29/2016 08:43   Dg Chest Portable 1 View  Result Date: 01/28/2016 CLINICAL DATA:  83 year old female with worsening shortness of breath and rales. EXAM: PORTABLE CHEST 1 VIEW COMPARISON:  Chest x-ray 01/28/2016. FINDINGS: Marked worsening diffuse peribronchial cuffing and patchy interstitial/airspace opacities throughout the lungs bilaterally (right greater than left), most confluent in the right upper lobe. No definite pleural effusions. Heart size is borderline enlarged. Upper mediastinal contours are within normal limits. IMPRESSION: 1. The appearance the chest suggests progressive multilobar bronchopneumonia. Electronically Signed  By: Trudie Reed M.D.   On: 01/28/2016 19:23    Time Spent in minutes  35   Eddie North M.D on 01/29/2016 at 1:50 PM  Between 7am to 7pm - Pager - [phone]  After 7pm go to [email or web address] - password [email or web address]  [facility] -  Office  [phone]

## 2016-01-29 NOTE — Progress Notes (Signed)
Patient attempted to get out of bed after bowel movement. HR 130-140s Afib, RR 40s, patient wheezing. Prn breathing treatment given 2mL of Cardizem IV given prn. Patient requests to be left alone to sleep. Will continue monitoring patient.

## 2016-01-30 ENCOUNTER — Inpatient Hospital Stay (HOSPITAL_COMMUNITY): Payer: Medicare Other

## 2016-01-30 DIAGNOSIS — J111 Influenza due to unidentified influenza virus with other respiratory manifestations: Secondary | ICD-10-CM

## 2016-01-30 DIAGNOSIS — R9431 Abnormal electrocardiogram [ECG] [EKG]: Secondary | ICD-10-CM

## 2016-01-30 LAB — BASIC METABOLIC PANEL
ANION GAP: 10 (ref 5–15)
BUN: 25 mg/dL — AB (ref 6–20)
CO2: 22 mmol/L (ref 22–32)
Calcium: 8.3 mg/dL — ABNORMAL LOW (ref 8.9–10.3)
Chloride: 104 mmol/L (ref 101–111)
Creatinine, Ser: 0.9 mg/dL (ref 0.44–1.00)
GFR calc Af Amer: >60 mL/min (ref >60)
GFR, EST NON AFRICAN AMERICAN: 54 mL/min — AB (ref >60)
GLUCOSE: 126 mg/dL — AB (ref 65–99)
POTASSIUM: 3.6 mmol/L (ref 3.5–5.1)
SODIUM: 136 mmol/L (ref 135–145)

## 2016-01-30 LAB — ECHOCARDIOGRAM COMPLETE
AOPV: 0.42 m/s
AOVTI: 45 cm
AV Area VTI: 1.08 cm2
AV VEL mean LVOT/AV: 0.41
AV peak Index: 0.65
AV vel: 1.11
AVAREAMEANV: 1.05 cm2
AVAREAMEANVIN: 0.63 cm2/m2
AVAREAVTIIND: 0.66 cm2/m2
AVG: 13 mmHg
AVPG: 22 mmHg
AVPKVEL: 236 cm/s
DOP CAL AO MEAN VELOCITY: 165 cm/s
FS: 29 % (ref 28–44)
Height: 64 in
IV/PV OW: 0.9
LA diam end sys: 31 mm
LA diam index: 1.86 cm/m2
LA vol index: 30.8 mL/m2
LASIZE: 31 mm
LAVOL: 51.4 mL
LAVOLA4C: 54 mL
LVOT VTI: 19.6 cm
LVOT area: 2.54 cm2
LVOT diameter: 18 mm
LVOT peak VTI: 0.44 cm
LVOT peak vel: 100 cm/s
LVOTSV: 50 mL
PW: 11.8 mm — AB (ref 0.6–1.1)
Valve area index: 0.66
Valve area: 1.11 cm2
WEIGHTICAEL: 2204.6 [oz_av]

## 2016-01-30 LAB — CBC
HCT: 40.8 % (ref 36.0–46.0)
Hemoglobin: 13.3 g/dL (ref 12.0–15.0)
MCH: 31.3 pg (ref 26.0–34.0)
MCHC: 32.6 g/dL (ref 30.0–36.0)
MCV: 96 fL (ref 78.0–100.0)
PLATELETS: 142 10*3/uL — AB (ref 150–400)
RBC: 4.25 MIL/uL (ref 3.87–5.11)
RDW: 13.4 % (ref 11.5–15.5)
WBC: 13.2 10*3/uL — AB (ref 4.0–10.5)

## 2016-01-30 MED ORDER — FUROSEMIDE 10 MG/ML IJ SOLN
40.0000 mg | Freq: Once | INTRAMUSCULAR | Status: AC
  Administered 2016-01-30: 40 mg via INTRAVENOUS
  Filled 2016-01-30: qty 4

## 2016-01-30 MED ORDER — DILTIAZEM HCL 60 MG PO TABS: 60.0000 mg | ORAL_TABLET | Freq: Three times a day (TID) | ORAL | Status: DC

## 2016-01-30 MED ORDER — DILTIAZEM HCL 60 MG PO TABS
60.0000 mg | ORAL_TABLET | Freq: Three times a day (TID) | ORAL | Status: DC
  Administered 2016-01-30 – 2016-01-31 (×3): 60 mg via ORAL
  Filled 2016-01-30 (×3): qty 1

## 2016-01-30 MED ORDER — LORAZEPAM 1 MG PO TABS
1.0000 mg | ORAL_TABLET | Freq: Once | ORAL | Status: AC
  Administered 2016-01-30: 1 mg via ORAL
  Filled 2016-01-30: qty 1

## 2016-01-30 NOTE — Progress Notes (Addendum)
Pt transferred to 5w27 from 4N. Pt is A&OX4. Pt's family at bedside. Pt's arms and legs bilateral are bruised. Pt on 3L of O2. Placed pt on telemetry. Pt is a high fall risk placed on bed alarm and told to call for assistance before getting up. Pt stated understanding. Told pt and family that pt in camera room, pt and family states that this is good. Will continue to monitor pt .Jerrye BushyJenny Thacker,RN

## 2016-01-30 NOTE — Progress Notes (Addendum)
PROGRESS NOTE                                                                                                                                                                                                             Patient Demographics:    Loretta Todd, is a 83 y.o. female, DOB - 03-17-1921, IEP:[id number]  Admit date - 01/28/2016   Admitting Physician Alba Cory, MD  Outpatient Primary MD for the patient is Delorse Lek, MD  LOS - 2  Outpatient Specialists: NONE  Chief Complaint  Patient presents with  . Fatigue       Brief Narrative   83 year old female with history of asthma, osteoarthritis, transient third-degree AV block (transient following finger surgery  in 2015 with no recurrence) chronic left bundle-branch block (negative for ischemia on stress test in 2004) who presented to the ED with chills with weakness, increasing shortness of breath worse with exertion and cough and lightheadedness for past few days. In the ED she was septic with hypotension, tachycardia and tachypnea with leukocytosis (WBC 17 K) chest x-ray showed multifocal pneumonia. Lactic acid was elevated. Admitted to stepdown unit for further management. Patient received IV fluid bolus for sepsis protocol in the ED following which she went into acute pulmonary edema. Improved after stopping fluids and given IV Lasix.   Subjective:   Symptoms better. Occasional episodes of rapid A. fib on the monitor.   Assessment  & Plan :    Principal Problem:   Severe sepsis (HCC) Secondary to multilobar pneumonia and influenza A. Blood culture on admission growing Streptococcus species. Narrowed antibiotics to Rocephin. Added Tamiflu. Sepsis resolved. Sepsis improving. Supportive care with Tylenol and antitussives.   Active Problems: New onset atrial fibrillation with RVR Possibly secondary to sepsis. TSH normal. Placed on scheduled  Cardizem with better rate control.   Although there is history of third-degree AV block this is likely transient in the PACU following removal of cyst of her finger. She did not have any further symptoms. 2-D echo shows EF of 60-65% with normal wall motion, mild AR and moderate MR. Mildly increased PA pressure (38 mmHg)   Acute respiratory failure with hypoxia (HCC) Secondary to lobar pneumonia. Plan as above. Follow respiratory viral panel.     LBBB (left bundle branch block) Chronic.  Prolonged QTC  Avoid QT prolonging  agents.   Sundowning with confusion Added bedtime ambien for sleep with good response. Avoid Seroquel or Haldol given prolonged QT.    Code Status : DO NOT RESUSCITATE  Family Communication  : Daughter at bedside  Disposition Plan  : Home pending clinical improvement, possibly in the next 48 hours.   Transfer to telemetry. Discontinue Foley, PT evaluation.  Barriers For Discharge : Active symptoms  Consults  : None  Procedures  : None  DVT Prophylaxis  :  Lovenox -   Lab Results  Component Value Date   PLT 142 (L) 01/30/2016    Antibiotics  :    Anti-infectives    Start     Dose/Rate Route Frequency Ordered Stop   01/29/16 1800  oseltamivir (TAMIFLU) capsule 30 mg     30 mg Oral 2 times daily 01/29/16 1451 02/03/16 2159   01/29/16 1500  cefTRIAXone (ROCEPHIN) 1 g in dextrose 5 % 50 mL IVPB  Status:  Discontinued     1 g 100 mL/hr over 30 Minutes Intravenous Every 24 hours 01/28/16 1544 01/29/16 1059   01/29/16 1115  cefTRIAXone (ROCEPHIN) 2 g in dextrose 5 % 50 mL IVPB     2 g 100 mL/hr over 30 Minutes Intravenous Every 24 hours 01/29/16 1100     01/28/16 1345  cefTRIAXone (ROCEPHIN) 1 g in dextrose 5 % 50 mL IVPB     1 g 100 mL/hr over 30 Minutes Intravenous  Once 01/28/16 1336 01/28/16 1522   01/28/16 1345  azithromycin (ZITHROMAX) 500 mg in dextrose 5 % 250 mL IVPB     500 mg 250 mL/hr over 60 Minutes Intravenous  Once 01/28/16 1336  01/28/16 1707        Objective:   Vitals:   01/30/16 0400 01/30/16 0700 01/30/16 0800 01/30/16 1100  BP:  104/72 99/74 110/65  Pulse: 100 86 93 86  Resp: 20 19 20  (!) 23  Temp:  97.6 F (36.4 C) (!) 96.7 F (35.9 C) 97.1 F (36.2 C)  TempSrc:  Axillary Axillary Oral  SpO2: 98% 98% 96% 96%  Weight:      Height:        Wt Readings from Last 3 Encounters:  01/29/16 62.5 kg (137 lb 12.6 oz)  08/02/13 65.7 kg (144 lb 12.8 oz)  06/27/13 65.3 kg (144 lb)     Intake/Output Summary (Last 24 hours) at 01/30/16 1321 Last data filed at 01/30/16 1040  Gross per 24 hour  Intake                0 ml  Output             1652 ml  Net            -1652 ml     Physical Exam  Gen: not in distress HEENT:  moist mucosa, supple neck Chest:Scattered rhonchi over right lung (improved) CVS: S1 and S2 is irregularly irregular, no murmurs, rubs or gallop GI: soft, NT, ND,  Musculoskeletal: warm, no edema CNS: Alert and oriented , nonfocal    Data Review:    CBC  Recent Labs Lab 01/28/16 1315 01/28/16 1344 01/28/16 1639 01/29/16 0210 01/30/16 0211  WBC 16.8*  --  15.5* 17.9* 13.2*  HGB 14.7 15.6* 14.7 14.5 13.3  HCT 42.9 46.0 42.3 42.8 40.8  PLT 142*  --  139* 143* 142*  MCV 94.3  --  94.2 95.1 96.0  MCH 32.3  --  32.7 32.2 31.3  MCHC  34.3  --  34.8 33.9 32.6  RDW 13.2  --  13.2 13.5 13.4  LYMPHSABS 0.4*  --  0.5*  --   --   MONOABS 0.6  --  0.5  --   --   EOSABS 0.0  --  0.0  --   --   BASOSABS 0.0  --  0.0  --   --     Chemistries   Recent Labs Lab 01/28/16 1315 01/28/16 1344 01/28/16 1639 01/29/16 0210 01/30/16 0211  NA 134* 135 133* 136 136  K 3.9 5.3* 4.0 3.6 3.6  CL 106 105 103 103 104  CO2 16*  --  18* 22 22  GLUCOSE 143* 138* 180* 110* 126*  BUN 20 31* 20 19 25*  CREATININE 1.07* 0.90 1.07* 1.01* 0.90  CALCIUM 8.5*  --  8.4* 8.2* 8.3*  MG  --   --  2.0  --   --   AST 76*  --  73*  --   --   ALT 69*  --  64*  --   --   ALKPHOS 57  --  54  --   --    BILITOT 0.9  --  1.0  --   --    ------------------------------------------------------------------------------------------------------------------ No results for input(s): CHOL, HDL, LDLCALC, TRIG, CHOLHDL, LDLDIRECT in the last 72 hours.  No results found for: HGBA1C ------------------------------------------------------------------------------------------------------------------  Recent Labs  01/28/16 1639  TSH 0.416   ------------------------------------------------------------------------------------------------------------------ No results for input(s): VITAMINB12, FOLATE, FERRITIN, TIBC, IRON, RETICCTPCT in the last 72 hours.  Coagulation profile  Recent Labs Lab 01/28/16 1639  INR 1.24    No results for input(s): DDIMER in the last 72 hours.  Cardiac Enzymes No results for input(s): CKMB, TROPONINI, MYOGLOBIN in the last 168 hours.  Invalid input(s): CK ------------------------------------------------------------------------------------------------------------------ No results found for: BNP  Inpatient Medications  Scheduled Meds: . cefTRIAXone (ROCEPHIN)  IV  2 g Intravenous Q24H  . diltiazem  60 mg Oral Q8H  . enoxaparin (LOVENOX) injection  40 mg Subcutaneous Q24H  . guaiFENesin  1,200 mg Oral BID  . mouth rinse  15 mL Mouth Rinse BID  . mometasone-formoterol  2 puff Inhalation BID  . oseltamivir  30 mg Oral BID   Continuous Infusions: PRN Meds:.acetaminophen **OR** acetaminophen, diltiazem, [EXPIRED] ipratropium-albuterol **FOLLOWED BY** ipratropium-albuterol, ondansetron **OR** ondansetron (ZOFRAN) IV, polyethylene glycol  Micro Results Recent Results (from the past 240 hour(s))  Blood culture (routine x 2)     Status: Abnormal (Preliminary result)   Collection Time: 01/28/16  2:36 PM  Result Value Ref Range Status   Specimen Description BLOOD RIGHT ANTECUBITAL  Final   Special Requests BOTTLES DRAWN AEROBIC AND ANAEROBIC 5CC  Final   Culture   Setup Time   Final    GRAM POSITIVE COCCI IN CHAINS AEROBIC BOTTLE ONLY CRITICAL RESULT CALLED TO, READ BACK BY AND VERIFIED WITH: ADagoberto Ligas.D. 10:45 01/29/16 ([email or web address])    Culture (A)  Final    STREPTOCOCCUS PYOGENES SUSCEPTIBILITIES TO FOLLOW    Report Status PENDING  Incomplete  Blood Culture ID Panel (Reflexed)     Status: Abnormal   Collection Time: 01/28/16  2:36 PM  Result Value Ref Range Status   Enterococcus species NOT DETECTED NOT DETECTED Final   Listeria monocytogenes NOT DETECTED NOT DETECTED Final   Staphylococcus species NOT DETECTED NOT DETECTED Final   Staphylococcus aureus NOT DETECTED NOT DETECTED Final   Streptococcus species DETECTED (A) NOT DETECTED Final    Comment:  CRITICAL RESULT CALLED TO, READ BACK BY AND VERIFIED WITH: ADagoberto Ligas.D. 10:45 01/29/16 ([email or web address])    Streptococcus agalactiae NOT DETECTED NOT DETECTED Final   Streptococcus pneumoniae NOT DETECTED NOT DETECTED Final   Streptococcus pyogenes DETECTED (A) NOT DETECTED Final    Comment: CRITICAL RESULT CALLED TO, READ BACK BY AND VERIFIED WITH: ADagoberto Ligas.D. 10:45 01/29/16 ([email or web address])    Acinetobacter baumannii NOT DETECTED NOT DETECTED Final   Enterobacteriaceae species NOT DETECTED NOT DETECTED Final   Enterobacter cloacae complex NOT DETECTED NOT DETECTED Final   Escherichia coli NOT DETECTED NOT DETECTED Final   Klebsiella oxytoca NOT DETECTED NOT DETECTED Final   Klebsiella pneumoniae NOT DETECTED NOT DETECTED Final   Proteus species NOT DETECTED NOT DETECTED Final   Serratia marcescens NOT DETECTED NOT DETECTED Final   Haemophilus influenzae NOT DETECTED NOT DETECTED Final   Neisseria meningitidis NOT DETECTED NOT DETECTED Final   Pseudomonas aeruginosa NOT DETECTED NOT DETECTED Final   Candida albicans NOT DETECTED NOT DETECTED Final   Candida glabrata NOT DETECTED NOT DETECTED Final   Candida krusei NOT DETECTED NOT DETECTED Final   Candida parapsilosis NOT DETECTED  NOT DETECTED Final   Candida tropicalis NOT DETECTED NOT DETECTED Final  Blood culture (routine x 2)     Status: None (Preliminary result)   Collection Time: 01/28/16  2:40 PM  Result Value Ref Range Status   Specimen Description BLOOD RIGHT WRIST  Final   Special Requests BOTTLES DRAWN AEROBIC AND ANAEROBIC 5CC  Final   Culture NO GROWTH 1 DAY  Final   Report Status PENDING  Incomplete  Respiratory Panel by PCR     Status: Abnormal   Collection Time: 01/29/16 12:28 AM  Result Value Ref Range Status   Adenovirus NOT DETECTED NOT DETECTED Final   Coronavirus 229E NOT DETECTED NOT DETECTED Final   Coronavirus HKU1 NOT DETECTED NOT DETECTED Final   Coronavirus NL63 NOT DETECTED NOT DETECTED Final   Coronavirus OC43 NOT DETECTED NOT DETECTED Final   Metapneumovirus NOT DETECTED NOT DETECTED Final   Rhinovirus / Enterovirus NOT DETECTED NOT DETECTED Final   Influenza A H3 DETECTED (A) NOT DETECTED Final   Influenza B NOT DETECTED NOT DETECTED Final   Parainfluenza Virus 1 NOT DETECTED NOT DETECTED Final   Parainfluenza Virus 2 NOT DETECTED NOT DETECTED Final   Parainfluenza Virus 3 NOT DETECTED NOT DETECTED Final   Parainfluenza Virus 4 NOT DETECTED NOT DETECTED Final   Respiratory Syncytial Virus NOT DETECTED NOT DETECTED Final   Bordetella pertussis NOT DETECTED NOT DETECTED Final   Chlamydophila pneumoniae NOT DETECTED NOT DETECTED Final   Mycoplasma pneumoniae NOT DETECTED NOT DETECTED Final  MRSA PCR Screening     Status: None   Collection Time: 01/29/16 12:29 AM  Result Value Ref Range Status   MRSA by PCR NEGATIVE NEGATIVE Final    Comment:        The GeneXpert MRSA Assay (FDA approved for NASAL specimens only), is one component of a comprehensive MRSA colonization surveillance program. It is not intended to diagnose MRSA infection nor to guide or monitor treatment for MRSA infections.     Radiology Reports Dg Chest 2 View  Result Date: 01/28/2016 CLINICAL DATA:   Cough and flu like symptoms since Sunday. Very week. EXAM: CHEST  2 VIEW COMPARISON:  None. FINDINGS: The heart is upper limits of normal in size. Emphysematous changes are present. Right upper and lower lobe superimposed airspace disease is present. No focal airspace disease  is present on the left. The visualized soft tissues and bony thorax are unremarkable. IMPRESSION: 1. Right upper and lower lobe airspace disease concerning for pneumonia. 2. Emphysema. Electronically Signed   By: Marin Roberts M.D.   On: 01/28/2016 12:59   Ct Head Wo Contrast  Result Date: 01/28/2016 CLINICAL DATA:  Patient status post fall. No reported loss consciousness. EXAM: CT HEAD WITHOUT CONTRAST TECHNIQUE: Contiguous axial images were obtained from the base of the skull through the vertex without intravenous contrast. COMPARISON:  None. FINDINGS: Brain: Ventricles and sulci are prominent compatible with atrophy. Periventricular and subcortical white matter hypodensity compatible with chronic microvascular ischemic changes. Old bilateral basal ganglia and left thalamus lacunar infarcts. No acute cortically based infarct, intracranial hemorrhage, mass lesion or mass-effect. Vascular: Internal carotid arterial vascular calcifications. Skull: Intact. Sinuses/Orbits: Paranasal sinuses are well aerated. Orbits are unremarkable. Other: None. IMPRESSION: No acute intracranial process. Chronic microvascular ischemic changes and cortical atrophy. Old bilateral basal ganglia lacunar infarcts. Electronically Signed   By: Annia Belt M.D.   On: 01/28/2016 13:39   Dg Chest Port 1 View  Result Date: 01/29/2016 CLINICAL DATA:  Dyspnea EXAM: PORTABLE CHEST 1 VIEW COMPARISON:  Prior studies dating back through 01/28/2016 FINDINGS: Cardiomegaly with aortic atherosclerosis. Interval mild increase in vascular congestion consistent with CHF with superimposed right upper and lower lobe airspace disease suggestive of a move to the the pneumonia.  No acute osseous abnormality. No significant effusion or pneumothorax. IMPRESSION: Interval development of mild CHF with superimposed right lower lobe and right upper lobe pneumonic consolidations. Stable cardiomegaly with aortic atherosclerosis. Electronically Signed   By: Tollie Eth M.D.   On: 01/29/2016 23:48   Dg Chest Port 1 View  Result Date: 01/29/2016 CLINICAL DATA:  Acute respiratory failure EXAM: PORTABLE CHEST 1 VIEW COMPARISON:  01/28/2016 FINDINGS: Improving aeration throughout the left lung. Continued areas of consolidation in the right upper lobe and right lower lobe, slightly improved since prior study. Mild cardiomegaly. No visible effusions. IMPRESSION: Persistent but improving areas of consolidation in the right lung concerning for pneumonia. Improved airspace disease throughout the left lung. Electronically Signed   By: Charlett Nose M.D.   On: 01/29/2016 08:43   Dg Chest Portable 1 View  Result Date: 01/28/2016 CLINICAL DATA:  83 year old female with worsening shortness of breath and rales. EXAM: PORTABLE CHEST 1 VIEW COMPARISON:  Chest x-ray 01/28/2016. FINDINGS: Marked worsening diffuse peribronchial cuffing and patchy interstitial/airspace opacities throughout the lungs bilaterally (right greater than left), most confluent in the right upper lobe. No definite pleural effusions. Heart size is borderline enlarged. Upper mediastinal contours are within normal limits. IMPRESSION: 1. The appearance the chest suggests progressive multilobar bronchopneumonia. Electronically Signed   By: Trudie Reed M.D.   On: 01/28/2016 19:23    Time Spent in minutes  35   Eddie North M.D on 01/30/2016 at 1:21 PM  Between 7am to 7pm - Pager - [phone]  After 7pm go to [email or web address] - password [facility]  [facility] -  Office  [phone]

## 2016-01-30 NOTE — Progress Notes (Signed)
Received report from 4N.

## 2016-01-30 NOTE — Progress Notes (Signed)
Went to speak with patient's grandson at his request. Grandson upset her nighttime medications, including sleeping med were not given at 8 pm when she would normally go to bed. Explained that these meds were normally given between 9-10pm and that we had paged MD for sleeping medication. Messan, primary RN explained that he told her he would bring all the medications in at once when we had the order for sleep medication also. Grandson also upset that when he used call bell and asked for the nurse (without specifying what the need was) and that he didn't come in right away and she had to use the bathroom. At call bell automatic callback he advised NS the need was to use the bathroom and the NT was called who responded within a couple of minutes as soon as she was able.  Grandson said he wanted her treated as a human being and not just a number in the bed. I assured him we would call the MD and get it sorted out and as soon as the medication was available we would bring it all in before she was transferred so there would be no confusion. Darlys GalesKatherine Shorr, NP responded to page and explained situation and medication was ordered.

## 2016-01-30 NOTE — Progress Notes (Signed)
  Echocardiogram 2D Echocardiogram has been performed.  Delcie RochENNINGTON, Devynne Sturdivant 01/30/2016, 9:40 AM

## 2016-01-31 LAB — CULTURE, BLOOD (ROUTINE X 2)

## 2016-01-31 MED ORDER — FLUTICASONE-SALMETEROL 250-50 MCG/DOSE IN AEPB
1.0000 | INHALATION_SPRAY | Freq: Two times a day (BID) | RESPIRATORY_TRACT | Status: DC
  Administered 2016-01-31 – 2016-02-01 (×2): 1 via RESPIRATORY_TRACT

## 2016-01-31 MED ORDER — LORAZEPAM 1 MG PO TABS
1.0000 mg | ORAL_TABLET | Freq: Once | ORAL | Status: AC
  Administered 2016-01-31: 1 mg via ORAL
  Filled 2016-01-31: qty 1

## 2016-01-31 NOTE — Progress Notes (Signed)
Pt and family member requested for Ativan to help pt relax to go to bed. MD was notified. Orders were put in for Ativan. Med given to pt. Will continue to monitor

## 2016-01-31 NOTE — Progress Notes (Signed)
PROGRESS NOTE                                                                                                                                                                                                             Patient Demographics:    Loretta Todd, is a 83 y.o. female, DOB - Oct 23, 1921, ZOX:[id number]  Admit date - 01/28/2016   Admitting Physician Alba Cory, MD  Outpatient Primary MD for the patient is Delorse Lek, MD  LOS - 3  Outpatient Specialists: NONE  Chief Complaint  Patient presents with  . Fatigue       Brief Narrative   83 year old female with history of asthma, osteoarthritis, transient third-degree AV block (transient following finger surgery  in 2015 with no recurrence) chronic left bundle-branch block (negative for ischemia on stress test in 2004) who presented to the ED with chills with weakness, increasing shortness of breath worse with exertion and cough and lightheadedness for past few days. In the ED she was septic with hypotension, tachycardia and tachypnea with leukocytosis (WBC 17 K) chest x-ray showed multifocal pneumonia. Lactic acid was elevated. Admitted to stepdown unit for further management. Patient received IV fluid bolus for sepsis protocol in the ED following which she went into acute pulmonary edema. Improved after stopping fluids and given IV Lasix.   Subjective:   Symptoms improving. Still has some wheezing.   Assessment  & Plan :    Principal Problem:   Severe sepsis (HCC) Secondary to multilobar pneumonia and influenza A. Cyst now resolved. Blood culture on admission growing Streptococcus species. Narrowed antibiotics to Rocephin. Continue Tamiflu.  Supportive care with Tylenol and antitussives.   Active Problems: New onset atrial fibrillation with RVR Secondary to sepsis.. TSH normal.  2-D echo shows EF of 60-65% with normal wall motion, mild AR and  moderate MR. Mildly increased PA pressure (38 mmHg) Stable on monitor past 48 hours. Will discontinue Cardizem.   Acute respiratory failure with hypoxia (HCC) Secondary to lobar pneumonia and influenza. Plan as above. Assess need for home O2 prior to discharge     LBBB (left bundle branch block) Chronic.  Prolonged QTC  Avoid QT prolonging agents.   Sundowning with confusion Added bedtime ambien for sleep with good response. Avoid Seroquel or Haldol given prolonged QT.    Code Status : DO NOT  RESUSCITATE  Family Communication  : Daughter at bedside  Disposition Plan  : Home tomorrow with home health if improved    Barriers For Discharge : Improving symptoms  Consults  : None  Procedures  : None  DVT Prophylaxis  :  Lovenox -   Lab Results  Component Value Date   PLT 142 (L) 01/30/2016    Antibiotics  :    Anti-infectives    Start     Dose/Rate Route Frequency Ordered Stop   01/29/16 1800  oseltamivir (TAMIFLU) capsule 30 mg     30 mg Oral 2 times daily 01/29/16 1451 02/03/16 2159   01/29/16 1500  cefTRIAXone (ROCEPHIN) 1 g in dextrose 5 % 50 mL IVPB  Status:  Discontinued     1 g 100 mL/hr over 30 Minutes Intravenous Every 24 hours 01/28/16 1544 01/29/16 1059   01/29/16 1115  cefTRIAXone (ROCEPHIN) 2 g in dextrose 5 % 50 mL IVPB     2 g 100 mL/hr over 30 Minutes Intravenous Every 24 hours 01/29/16 1100     01/28/16 1345  cefTRIAXone (ROCEPHIN) 1 g in dextrose 5 % 50 mL IVPB     1 g 100 mL/hr over 30 Minutes Intravenous  Once 01/28/16 1336 01/28/16 1522   01/28/16 1345  azithromycin (ZITHROMAX) 500 mg in dextrose 5 % 250 mL IVPB     500 mg 250 mL/hr over 60 Minutes Intravenous  Once 01/28/16 1336 01/28/16 1707        Objective:   Vitals:   01/30/16 1624 01/30/16 1635 01/30/16 1930 01/31/16 0555  BP: (!) 113/51 (!) 113/51 (!) 141/73 117/73  Pulse:  93 (!) 58 (!) 108  Resp:  (!) 23 (!) 29 20  Temp:  97.3 F (36.3 C) 97.7 F (36.5 C) 98 F (36.7 C)    TempSrc:  Oral Oral Oral  SpO2:  95% 93% 97%  Weight:      Height:        Wt Readings from Last 3 Encounters:  01/29/16 62.5 kg (137 lb 12.6 oz)  08/02/13 65.7 kg (144 lb 12.8 oz)  06/27/13 65.3 kg (144 lb)     Intake/Output Summary (Last 24 hours) at 01/31/16 1310 Last data filed at 01/31/16 0930  Gross per 24 hour  Intake              240 ml  Output                0 ml  Net              240 ml     Physical Exam  Gen: not in distress HEENT:  moist mucosa, supple neck Chest:Scattered rhonchi and fine bibasilar crackles bilaterally CVS: S1 and S2 is irregularly irregular, no murmurs, GI: soft, NT, ND,  Musculoskeletal: warm, no edema     Data Review:    CBC  Recent Labs Lab 01/28/16 1315 01/28/16 1344 01/28/16 1639 01/29/16 0210 01/30/16 0211  WBC 16.8*  --  15.5* 17.9* 13.2*  HGB 14.7 15.6* 14.7 14.5 13.3  HCT 42.9 46.0 42.3 42.8 40.8  PLT 142*  --  139* 143* 142*  MCV 94.3  --  94.2 95.1 96.0  MCH 32.3  --  32.7 32.2 31.3  MCHC 34.3  --  34.8 33.9 32.6  RDW 13.2  --  13.2 13.5 13.4  LYMPHSABS 0.4*  --  0.5*  --   --   MONOABS 0.6  --  0.5  --   --  EOSABS 0.0  --  0.0  --   --   BASOSABS 0.0  --  0.0  --   --     Chemistries   Recent Labs Lab 01/28/16 1315 01/28/16 1344 01/28/16 1639 01/29/16 0210 01/30/16 0211  NA 134* 135 133* 136 136  K 3.9 5.3* 4.0 3.6 3.6  CL 106 105 103 103 104  CO2 16*  --  18* 22 22  GLUCOSE 143* 138* 180* 110* 126*  BUN 20 31* 20 19 25*  CREATININE 1.07* 0.90 1.07* 1.01* 0.90  CALCIUM 8.5*  --  8.4* 8.2* 8.3*  MG  --   --  2.0  --   --   AST 76*  --  73*  --   --   ALT 69*  --  64*  --   --   ALKPHOS 57  --  54  --   --   BILITOT 0.9  --  1.0  --   --    ------------------------------------------------------------------------------------------------------------------ No results for input(s): CHOL, HDL, LDLCALC, TRIG, CHOLHDL, LDLDIRECT in the last 72 hours.  No results found for:  HGBA1C ------------------------------------------------------------------------------------------------------------------  Recent Labs  01/28/16 1639  TSH 0.416   ------------------------------------------------------------------------------------------------------------------ No results for input(s): VITAMINB12, FOLATE, FERRITIN, TIBC, IRON, RETICCTPCT in the last 72 hours.  Coagulation profile  Recent Labs Lab 01/28/16 1639  INR 1.24    No results for input(s): DDIMER in the last 72 hours.  Cardiac Enzymes No results for input(s): CKMB, TROPONINI, MYOGLOBIN in the last 168 hours.  Invalid input(s): CK ------------------------------------------------------------------------------------------------------------------ No results found for: BNP  Inpatient Medications  Scheduled Meds: . cefTRIAXone (ROCEPHIN)  IV  2 g Intravenous Q24H  . diltiazem  60 mg Oral Q8H  . enoxaparin (LOVENOX) injection  40 mg Subcutaneous Q24H  . guaiFENesin  1,200 mg Oral BID  . mouth rinse  15 mL Mouth Rinse BID  . mometasone-formoterol  2 puff Inhalation BID  . oseltamivir  30 mg Oral BID   Continuous Infusions: PRN Meds:.acetaminophen **OR** acetaminophen, diltiazem, ondansetron **OR** ondansetron (ZOFRAN) IV, polyethylene glycol  Micro Results Recent Results (from the past 240 hour(s))  Blood culture (routine x 2)     Status: Abnormal   Collection Time: 01/28/16  2:36 PM  Result Value Ref Range Status   Specimen Description BLOOD RIGHT ANTECUBITAL  Final   Special Requests BOTTLES DRAWN AEROBIC AND ANAEROBIC 5CC  Final   Culture  Setup Time   Final    GRAM POSITIVE COCCI IN CHAINS AEROBIC BOTTLE ONLY CRITICAL RESULT CALLED TO, READ BACK BY AND VERIFIED WITH: ADagoberto Ligas.D. 10:45 01/29/16 (wilsonm)    Culture (A)  Final    GROUP A STREP (S.PYOGENES) ISOLATED HEALTH DEPARTMENT NOTIFIED    Report Status 01/31/2016 FINAL  Final   Organism ID, Bacteria GROUP A STREP (S.PYOGENES)  ISOLATED  Final      Susceptibility   Group a strep (s.pyogenes) isolated - MIC*    ERYTHROMYCIN <=0.12 SENSITIVE Sensitive     TETRACYCLINE <=0.25 SENSITIVE Sensitive     VANCOMYCIN <=0.12 SENSITIVE Sensitive     CLINDAMYCIN <=0.25 SENSITIVE Sensitive     PENICILLIN Value in next row Sensitive      SENSITIVE<=0.06    CEFTRIAXONE Value in next row Sensitive      SENSITIVE<=0.12    * GROUP A STREP (S.PYOGENES) ISOLATED  Blood Culture ID Panel (Reflexed)     Status: Abnormal   Collection Time: 01/28/16  2:36 PM  Result Value  Ref Range Status   Enterococcus species NOT DETECTED NOT DETECTED Final   Listeria monocytogenes NOT DETECTED NOT DETECTED Final   Staphylococcus species NOT DETECTED NOT DETECTED Final   Staphylococcus aureus NOT DETECTED NOT DETECTED Final   Streptococcus species DETECTED (A) NOT DETECTED Final    Comment: CRITICAL RESULT CALLED TO, READ BACK BY AND VERIFIED WITH: ADagoberto Ligas.D. 10:45 01/29/16 (wilsonm)    Streptococcus agalactiae NOT DETECTED NOT DETECTED Final   Streptococcus pneumoniae NOT DETECTED NOT DETECTED Final   Streptococcus pyogenes DETECTED (A) NOT DETECTED Final    Comment: CRITICAL RESULT CALLED TO, READ BACK BY AND VERIFIED WITH: ADagoberto Ligas.D. 10:45 01/29/16 (wilsonm)    Acinetobacter baumannii NOT DETECTED NOT DETECTED Final   Enterobacteriaceae species NOT DETECTED NOT DETECTED Final   Enterobacter cloacae complex NOT DETECTED NOT DETECTED Final   Escherichia coli NOT DETECTED NOT DETECTED Final   Klebsiella oxytoca NOT DETECTED NOT DETECTED Final   Klebsiella pneumoniae NOT DETECTED NOT DETECTED Final   Proteus species NOT DETECTED NOT DETECTED Final   Serratia marcescens NOT DETECTED NOT DETECTED Final   Haemophilus influenzae NOT DETECTED NOT DETECTED Final   Neisseria meningitidis NOT DETECTED NOT DETECTED Final   Pseudomonas aeruginosa NOT DETECTED NOT DETECTED Final   Candida albicans NOT DETECTED NOT DETECTED Final    Candida glabrata NOT DETECTED NOT DETECTED Final   Candida krusei NOT DETECTED NOT DETECTED Final   Candida parapsilosis NOT DETECTED NOT DETECTED Final   Candida tropicalis NOT DETECTED NOT DETECTED Final  Blood culture (routine x 2)     Status: None (Preliminary result)   Collection Time: 01/28/16  2:40 PM  Result Value Ref Range Status   Specimen Description BLOOD RIGHT WRIST  Final   Special Requests BOTTLES DRAWN AEROBIC AND ANAEROBIC 5CC  Final   Culture NO GROWTH 3 DAYS  Final   Report Status PENDING  Incomplete  Respiratory Panel by PCR     Status: Abnormal   Collection Time: 01/29/16 12:28 AM  Result Value Ref Range Status   Adenovirus NOT DETECTED NOT DETECTED Final   Coronavirus 229E NOT DETECTED NOT DETECTED Final   Coronavirus HKU1 NOT DETECTED NOT DETECTED Final   Coronavirus NL63 NOT DETECTED NOT DETECTED Final   Coronavirus OC43 NOT DETECTED NOT DETECTED Final   Metapneumovirus NOT DETECTED NOT DETECTED Final   Rhinovirus / Enterovirus NOT DETECTED NOT DETECTED Final   Influenza A H3 DETECTED (A) NOT DETECTED Final   Influenza B NOT DETECTED NOT DETECTED Final   Parainfluenza Virus 1 NOT DETECTED NOT DETECTED Final   Parainfluenza Virus 2 NOT DETECTED NOT DETECTED Final   Parainfluenza Virus 3 NOT DETECTED NOT DETECTED Final   Parainfluenza Virus 4 NOT DETECTED NOT DETECTED Final   Respiratory Syncytial Virus NOT DETECTED NOT DETECTED Final   Bordetella pertussis NOT DETECTED NOT DETECTED Final   Chlamydophila pneumoniae NOT DETECTED NOT DETECTED Final   Mycoplasma pneumoniae NOT DETECTED NOT DETECTED Final  MRSA PCR Screening     Status: None   Collection Time: 01/29/16 12:29 AM  Result Value Ref Range Status   MRSA by PCR NEGATIVE NEGATIVE Final    Comment:        The GeneXpert MRSA Assay (FDA approved for NASAL specimens only), is one component of a comprehensive MRSA colonization surveillance program. It is not intended to diagnose MRSA infection nor to  guide or monitor treatment for MRSA infections.     Radiology Reports Dg Chest 2 View  Result Date: 01/28/2016 CLINICAL DATA:  Cough and flu like symptoms since Sunday. Very week. EXAM: CHEST  2 VIEW COMPARISON:  None. FINDINGS: The heart is upper limits of normal in size. Emphysematous changes are present. Right upper and lower lobe superimposed airspace disease is present. No focal airspace disease is present on the left. The visualized soft tissues and bony thorax are unremarkable. IMPRESSION: 1. Right upper and lower lobe airspace disease concerning for pneumonia. 2. Emphysema. Electronically Signed   By: Marin Robertshristopher  Mattern M.D.   On: 01/28/2016 12:59   Ct Head Wo Contrast  Result Date: 01/28/2016 CLINICAL DATA:  Patient status post fall. No reported loss consciousness. EXAM: CT HEAD WITHOUT CONTRAST TECHNIQUE: Contiguous axial images were obtained from the base of the skull through the vertex without intravenous contrast. COMPARISON:  None. FINDINGS: Brain: Ventricles and sulci are prominent compatible with atrophy. Periventricular and subcortical white matter hypodensity compatible with chronic microvascular ischemic changes. Old bilateral basal ganglia and left thalamus lacunar infarcts. No acute cortically based infarct, intracranial hemorrhage, mass lesion or mass-effect. Vascular: Internal carotid arterial vascular calcifications. Skull: Intact. Sinuses/Orbits: Paranasal sinuses are well aerated. Orbits are unremarkable. Other: None. IMPRESSION: No acute intracranial process. Chronic microvascular ischemic changes and cortical atrophy. Old bilateral basal ganglia lacunar infarcts. Electronically Signed   By: Annia Beltrew  Davis M.D.   On: 01/28/2016 13:39   Dg Chest Port 1 View  Result Date: 01/29/2016 CLINICAL DATA:  Dyspnea EXAM: PORTABLE CHEST 1 VIEW COMPARISON:  Prior studies dating back through 01/28/2016 FINDINGS: Cardiomegaly with aortic atherosclerosis. Interval mild increase in vascular  congestion consistent with CHF with superimposed right upper and lower lobe airspace disease suggestive of a move to the the pneumonia. No acute osseous abnormality. No significant effusion or pneumothorax. IMPRESSION: Interval development of mild CHF with superimposed right lower lobe and right upper lobe pneumonic consolidations. Stable cardiomegaly with aortic atherosclerosis. Electronically Signed   By: Tollie Ethavid  Kwon M.D.   On: 01/29/2016 23:48   Dg Chest Port 1 View  Result Date: 01/29/2016 CLINICAL DATA:  Acute respiratory failure EXAM: PORTABLE CHEST 1 VIEW COMPARISON:  01/28/2016 FINDINGS: Improving aeration throughout the left lung. Continued areas of consolidation in the right upper lobe and right lower lobe, slightly improved since prior study. Mild cardiomegaly. No visible effusions. IMPRESSION: Persistent but improving areas of consolidation in the right lung concerning for pneumonia. Improved airspace disease throughout the left lung. Electronically Signed   By: Charlett NoseKevin  Dover M.D.   On: 01/29/2016 08:43   Dg Chest Portable 1 View  Result Date: 01/28/2016 CLINICAL DATA:  83 year old female with worsening shortness of breath and rales. EXAM: PORTABLE CHEST 1 VIEW COMPARISON:  Chest x-ray 01/28/2016. FINDINGS: Marked worsening diffuse peribronchial cuffing and patchy interstitial/airspace opacities throughout the lungs bilaterally (right greater than left), most confluent in the right upper lobe. No definite pleural effusions. Heart size is borderline enlarged. Upper mediastinal contours are within normal limits. IMPRESSION: 1. The appearance the chest suggests progressive multilobar bronchopneumonia. Electronically Signed   By: Trudie Reedaniel  Entrikin M.D.   On: 01/28/2016 19:23    Time Spent in minutes  25   Eddie NorthHUNGEL, Amberlie Gaillard M.D on 01/31/2016 at 1:10 PM  Between 7am to 7pm - Pager - [phone]  After 7pm go to [email or web address] - password [email or web address]  [facility] -  Office  [phone]

## 2016-01-31 NOTE — Evaluation (Signed)
Physical Therapy Evaluation Patient Details Name: PREEYA CLECKLEY MRN: [id number] DOB: 02-26-1921 Today's Date: 01/31/2016   History of Present Illness  83 y.o. female with medical history significant of asthma, osteoarthritis, transient third degree AV block and chronic left bundle branch block with known ischemia who presented to the emergency room with complaints of chills and weakness, shortness of breath worse with exertion and cough, lightheadedness. She was admitted with severe sepsis from CAP.   Clinical Impression  Pt admitted with above diagnosis. Pt currently with functional limitations due to the deficits listed below (see PT Problem List). On eval, pt required min guard assist bed mobility, min assist transfers and min assist ambulation +1 HHA 10 feet. Gait distance limited by DOE and fatigue. Pt will benefit from skilled PT to increase their independence and safety with mobility to allow discharge to the venue listed below.  Pt reports her family is working to arrange 24-hour assist at home. If unable, ST SNF needs to be considered.     Follow Up Recommendations Home health PT;Supervision/Assistance - 24 hour    Equipment Recommendations  Rolling walker with 5" wheels    Recommendations for Other Services       Precautions / Restrictions Precautions Precautions: Fall      Mobility  Bed Mobility Overal bed mobility: Needs Assistance Bed Mobility: Supine to Sit     Supine to sit: HOB elevated;Min guard     General bed mobility comments: +rail, increased time to complete  Transfers Overall transfer level: Needs assistance Equipment used: None Transfers: Sit to/from [employer] Sit to Stand: Min assist Stand pivot transfers: Min assist       General transfer comment: min assist to stabilize balance  Ambulation/Gait Ambulation/Gait assistance: Min assist Ambulation Distance (Feet): 10 Feet Assistive device: 1 person hand held assist Gait  Pattern/deviations: Step-through pattern;Decreased stride length Gait velocity: decreased Gait velocity interpretation: Below normal speed for age/gender General Gait Details: unsteady; distance limited by DOE and fatigue. 3 L O2 via [place] in place throughout mobility. O2 sats 96% EOB and 93% following ambulation.  Stairs            Wheelchair Mobility    Modified Rankin (Stroke Patients Only)       Balance Overall balance assessment: Needs assistance Sitting-balance support: No upper extremity supported;Feet supported Sitting balance-Leahy Scale: Good     Standing balance support: Single extremity supported;During functional activity Standing balance-Leahy Scale: Poor                               Pertinent Vitals/Pain Pain Assessment: No/denies pain    Home Living Family/patient expects to be discharged to:: Private residence Living Arrangements: Alone Available Help at Discharge: Family;Available PRN/intermittently Type of Home: House Home Access: Stairs to enter     Home Layout: One level Home Equipment: None      Prior Function Level of Independence: Independent         Comments: Active. Lives alone. Drives.     Hand Dominance        Extremity/Trunk Assessment   Upper Extremity Assessment Upper Extremity Assessment: Generalized weakness    Lower Extremity Assessment Lower Extremity Assessment: Generalized weakness    Cervical / Trunk Assessment Cervical / Trunk Assessment: Normal  Communication   Communication: No difficulties  Cognition Arousal/Alertness: Awake/alert Behavior During Therapy: WFL for tasks assessed/performed Overall Cognitive Status: Within Functional Limits for tasks assessed  General Comments      Exercises     Assessment/Plan    PT Assessment Patient needs continued PT services  PT Problem List Decreased strength;Decreased activity tolerance;Decreased balance;Decreased  knowledge of use of DME;Decreased mobility;Decreased knowledge of precautions;Cardiopulmonary status limiting activity          PT Treatment Interventions DME instruction;Gait training;Stair training;Functional mobility training;Balance training;Therapeutic exercise;Therapeutic activities;Patient/family education    PT Goals (Current goals can be found in the Care Plan section)  Acute Rehab PT Goals Patient Stated Goal: home PT Goal Formulation: With patient Time For Goal Achievement: 01/31/16 Potential to Achieve Goals: Good    Frequency Min 3X/week   Barriers to discharge Decreased caregiver support      Co-evaluation               End of Session Equipment Utilized During Treatment: Gait belt;Oxygen Activity Tolerance: Patient limited by fatigue Patient left: in chair;with call bell/phone within reach Nurse Communication: Mobility status         Time: [id number] PT Time Calculation (min) (ACUTE ONLY): 17 min   Charges:   PT Evaluation $PT Eval Moderate Complexity: 1 Procedure     PT G Codes:        Ilda FoilGarrow, Hildreth Robart Rene 01/31/2016, 12:37 PM

## 2016-02-01 DIAGNOSIS — R6520 Severe sepsis without septic shock: Secondary | ICD-10-CM

## 2016-02-01 DIAGNOSIS — A419 Sepsis, unspecified organism: Secondary | ICD-10-CM

## 2016-02-01 DIAGNOSIS — S51011A Laceration without foreign body of right elbow, initial encounter: Secondary | ICD-10-CM

## 2016-02-01 MED ORDER — ALBUTEROL SULFATE HFA 108 (90 BASE) MCG/ACT IN AERS: 2.0000 | INHALATION_SPRAY | Freq: Four times a day (QID) | RESPIRATORY_TRACT | 2 refills | Status: DC | PRN

## 2016-02-01 MED ORDER — OSELTAMIVIR PHOSPHATE 30 MG PO CAPS: 30.0000 mg | ORAL_CAPSULE | Freq: Two times a day (BID) | ORAL | 0 refills | Status: AC

## 2016-02-01 MED ORDER — DOUBLE ANTIBIOTIC 500-10000 UNIT/GM EX OINT
1.0000 | TOPICAL_OINTMENT | Freq: Two times a day (BID) | CUTANEOUS | 0 refills | Status: DC
Start: 2016-02-01 — End: 2016-05-20

## 2016-02-01 MED ORDER — LEVOFLOXACIN 500 MG PO TABS: 500.0000 mg | ORAL_TABLET | Freq: Every day | ORAL | 0 refills | Status: AC

## 2016-02-01 MED ORDER — LEVOFLOXACIN 500 MG PO TABS
500.0000 mg | ORAL_TABLET | Freq: Every day | ORAL | 0 refills | Status: DC
Start: 2016-02-01 — End: 2016-02-01

## 2016-02-01 MED ORDER — GUAIFENESIN ER 600 MG PO TB12: 1200.0000 mg | ORAL_TABLET | Freq: Two times a day (BID) | ORAL | 0 refills | Status: DC

## 2016-02-01 NOTE — Progress Notes (Signed)
Attempted to change dressing on pt's elbow. Pt was asleep. Family member says she should be allowed to sleep,and refused the dressing change.He says it could be changed later when she is awake.Will continue to monitor.

## 2016-02-01 NOTE — Care Management Note (Signed)
Case Management Note  Patient Details  Name: Loretta Todd MRN: [id number] Date of Birth: [birth year]  Subjective/Objective: 83 y.o. F admitted from home where she has the assist of 4 daughters. Loretta CzarLeigh is with her Mother now and Stamps will be with her on Wednesday of this week as some are from [place]. HHPT and RN have been arranged with [facility] through [facility]. [facility]. No additional CM needs                    Action/Plan: Anticipate discharge home today. No further CM needs but will be available should additional discharge needs arise.   Expected Discharge Date:  02/01/16               Expected Discharge Plan:  Home w Home Health Services  In-House Referral:  NA  Discharge planning Services  CM Consult  Post Acute Care Choice:  Durable Medical Equipment, Home Health Choice offered to:  Patient, Adult Children Loretta Czar(Leigh Dilliard )  DME Arranged:  Oxygen (has RW, family making adjustments to home such as Grab bars and hand rails.) DME Agency:  [facility]  [facility]  [facility]  Status of Service:  Completed, signed off  If discussed at Long Length of Stay Meetings, dates discussed:    Additional Comments:  Yvone NeuCrutchfield, Cate Oravec M, RN 02/01/2016, 12:14 PM

## 2016-02-01 NOTE — Discharge Instructions (Signed)
Community-Acquired Pneumonia, Adult °Introduction °Pneumonia is an infection of the lungs. One type of pneumonia can happen while a person is in a hospital. A different type can happen when a person is not in a hospital (community-acquired pneumonia). It is easy for this kind to spread from person to person. It can spread to you if you breathe near an infected person who coughs or sneezes. Some symptoms include: °· A dry cough. °· A wet (productive) cough. °· Fever. °· Sweating. °· Chest pain. °Follow these instructions at home: °· Take over-the-counter and prescription medicines only as told by your doctor. °¨ Only take cough medicine if you are losing sleep. °¨ If you were prescribed an antibiotic medicine, take it as told by your doctor. Do not stop taking the antibiotic even if you start to feel better. °· Sleep with your head and neck raised (elevated). You can do this by putting a few pillows under your head, or you can sleep in a recliner. °· Do not use tobacco products. These include cigarettes, chewing tobacco, and e-cigarettes. If you need help quitting, ask your doctor. °· Drink enough water to keep your pee (urine) clear or pale yellow. °A shot (vaccine) can help prevent pneumonia. Shots are often suggested for: °· People older than 81 years of age. °· People older than 81 years of age: °¨ Who are having cancer treatment. °¨ Who have long-term (chronic) lung disease. °¨ Who have problems with their body's defense system (immune system). °You may also prevent pneumonia if you take these actions: °· Get the flu (influenza) shot every year. °· Go to the dentist as often as told. °· Wash your hands often. If soap and water are not available, use hand sanitizer. °Contact a doctor if: °· You have a fever. °· You lose sleep because your cough medicine does not help. °Get help right away if: °· You are short of breath and it gets worse. °· You have more chest pain. °· Your sickness gets worse. This is very  serious if: °¨ You are an older adult. °¨ Your body's defense system is weak. °· You cough up blood. °This information is not intended to replace advice given to you by your health care provider. Make sure you discuss any questions you have with your health care provider. °Document Released: 06/10/2007 Document Revised: 05/30/2015 Document Reviewed: 04/18/2014 °© 2017 [employer]vier ° °

## 2016-02-01 NOTE — Progress Notes (Signed)
SATURATION QUALIFICATIONS: (This note is used to comply with regulatory documentation for home oxygen)  Patient Saturations on Room Air at Rest = 88%  Patient Saturations on Room Air while Ambulating = 88%  Patient Saturations on 2 Liters of oxygen while Ambulating = 92%  Please briefly explain why patient needs home oxygen:

## 2016-02-01 NOTE — Discharge Summary (Signed)
Physician Discharge Summary  Loretta Todd ZOX:[id number] DOB: 10-Nov-1921 DOA: 01/28/2016  PCP: Delorse Lek, MD  Admit date: 01/28/2016 Discharge date: 02/01/2016  Admitted From: Home Disposition: Home  Recommendations for Outpatient Follow-up:  1. Follow up with PCP in 1-2 weeks 2. Patient will complete 7 day course of antibiotics on 1/31  and Tamiflu on 1/29. 3. Patient is being discharged on 2 L via nasal cannula continuously.  Home Health: RN and PT Equipment/Devices: Oxygen (2 L via nasal cannula)  Discharge Condition: Stable CODE STATUS: DO NOT RESUSCITATE Diet recommendation: Regular    Discharge Diagnoses:  Principal Problem:   Severe sepsis (HCC)   Active Problems:   LBBB (left bundle branch block)   Atrial fibrillation (HCC)   Acute respiratory failure with hypoxia (HCC)   Elbow laceration, right, initial encounter   Lobar pneumonia (HCC)  Brief narrative/history of present illness 83 year old female with history of asthma, osteoarthritis, transient third-degree AV block (transient following finger surgery  in 2015 with no recurrence) chronic left bundle-branch block (negative for ischemia on stress test in 2004) who presented to the ED with chills with weakness, increasing shortness of breath worse with exertion and cough and lightheadedness for past few days. In the ED she was septic with hypotension, tachycardia and tachypnea with leukocytosis (WBC 17 K) chest x-ray showed multifocal pneumonia. Lactic acid was elevated. Admitted to stepdown unit for further management. Patient received IV fluid bolus for sepsis protocol in the ED following which she went into acute pulmonary edema. Improved after stopping fluids and given IV Lasix.  Hospital course  Principal Problem:   Severe sepsis (HCC) Secondary to multilobar pneumonia and influenza A. Sepsis is resolved. Blood culture on admission growing Streptococcus species. Added antibiotics. Will discharged on  oral Levaquin to complete a seven-day course.  Continue Tamiflu (completes 5 day course after 1/28).  Supportive care with Tylenol and antitussives.   Active Problems: New onset atrial fibrillation with RVR Secondary to sepsis.. TSH normal.  2-D echo shows EF of 60-65% with normal wall motion, mild AR and moderate MR. Mildly increased PA pressure (38 mmHg) Stable on monitor past 48 hours occasional heart rate in 120s. Monitor during outpatient follow-up.   Acute respiratory failure with hypoxia (HCC) Secondary to lobar pneumonia and influenza. Plan as above. Evaluated for home O2 and patient requiring 2 L via nasal cannula both at rest and ambulation.    LBBB (left bundle branch block) Chronic.  Prolonged QTC  Avoid QT prolonging agents.  Generalized weakness. Seen by PT and recommend home health.     Code Status : DO NOT RESUSCITATE  Family Communication  : Daughter at bedside  Disposition Plan  : Home   Consults  : None  Procedures  : 2-D echo  Discharge Instructions   Allergies as of 02/01/2016   No Known Allergies     Medication List    TAKE these medications   albuterol 108 (90 Base) MCG/ACT inhaler Commonly known as:  PROVENTIL HFA;VENTOLIN HFA Inhale 2 puffs into the lungs every 6 (six) hours as needed for wheezing or shortness of breath.   Fluticasone-Salmeterol 250-50 MCG/DOSE Aepb Commonly known as:  ADVAIR Inhale 1 puff into the lungs 2 (two) times daily.   guaiFENesin 600 MG 12 hr tablet Commonly known as:  MUCINEX Take 2 tablets (1,200 mg total) by mouth 2 (two) times daily.   ibuprofen 200 MG tablet Commonly known as:  ADVIL,MOTRIN Take 200 mg by mouth 2 (two) times daily.  levofloxacin 500 MG tablet Commonly known as:  LEVAQUIN Take 1 tablet (500 mg total) by mouth daily.   oseltamivir 30 MG capsule Commonly known as:  TAMIFLU Take 1 capsule (30 mg total) by mouth 2 (two) times daily.   polymixin-bacitracin  500-10000 UNIT/GM Oint ointment Apply 1 application topically 2 (two) times daily.      Follow-up Information    BURNETT,BRENT A, MD. Schedule an appointment as soon as possible for a visit in 1 week(s).   Specialty:  Family Medicine Contact information: [address] [place] [id number] [phone]          No Known Allergies      Procedures/Studies: Dg Chest 2 View  Result Date: 01/28/2016 CLINICAL DATA:  Cough and flu like symptoms since Sunday. Very week. EXAM: CHEST  2 VIEW COMPARISON:  None. FINDINGS: The heart is upper limits of normal in size. Emphysematous changes are present. Right upper and lower lobe superimposed airspace disease is present. No focal airspace disease is present on the left. The visualized soft tissues and bony thorax are unremarkable. IMPRESSION: 1. Right upper and lower lobe airspace disease concerning for pneumonia. 2. Emphysema. Electronically Signed   By: Marin Robertshristopher  Mattern M.D.   On: 01/28/2016 12:59   Ct Head Wo Contrast  Result Date: 01/28/2016 CLINICAL DATA:  Patient status post fall. No reported loss consciousness. EXAM: CT HEAD WITHOUT CONTRAST TECHNIQUE: Contiguous axial images were obtained from the base of the skull through the vertex without intravenous contrast. COMPARISON:  None. FINDINGS: Brain: Ventricles and sulci are prominent compatible with atrophy. Periventricular and subcortical white matter hypodensity compatible with chronic microvascular ischemic changes. Old bilateral basal ganglia and left thalamus lacunar infarcts. No acute cortically based infarct, intracranial hemorrhage, mass lesion or mass-effect. Vascular: Internal carotid arterial vascular calcifications. Skull: Intact. Sinuses/Orbits: Paranasal sinuses are well aerated. Orbits are unremarkable. Other: None. IMPRESSION: No acute intracranial process. Chronic microvascular ischemic changes and cortical atrophy. Old bilateral basal ganglia lacunar  infarcts. Electronically Signed   By: Annia Beltrew  Davis M.D.   On: 01/28/2016 13:39   Dg Chest Port 1 View  Result Date: 01/29/2016 CLINICAL DATA:  Dyspnea EXAM: PORTABLE CHEST 1 VIEW COMPARISON:  Prior studies dating back through 01/28/2016 FINDINGS: Cardiomegaly with aortic atherosclerosis. Interval mild increase in vascular congestion consistent with CHF with superimposed right upper and lower lobe airspace disease suggestive of a move to the the pneumonia. No acute osseous abnormality. No significant effusion or pneumothorax. IMPRESSION: Interval development of mild CHF with superimposed right lower lobe and right upper lobe pneumonic consolidations. Stable cardiomegaly with aortic atherosclerosis. Electronically Signed   By: Tollie Ethavid  Kwon M.D.   On: 01/29/2016 23:48   Dg Chest Port 1 View  Result Date: 01/29/2016 CLINICAL DATA:  Acute respiratory failure EXAM: PORTABLE CHEST 1 VIEW COMPARISON:  01/28/2016 FINDINGS: Improving aeration throughout the left lung. Continued areas of consolidation in the right upper lobe and right lower lobe, slightly improved since prior study. Mild cardiomegaly. No visible effusions. IMPRESSION: Persistent but improving areas of consolidation in the right lung concerning for pneumonia. Improved airspace disease throughout the left lung. Electronically Signed   By: Charlett NoseKevin  Dover M.D.   On: 01/29/2016 08:43   Dg Chest Portable 1 View  Result Date: 01/28/2016 CLINICAL DATA:  83 year old female with worsening shortness of breath and rales. EXAM: PORTABLE CHEST 1 VIEW COMPARISON:  Chest x-ray 01/28/2016. FINDINGS: Marked worsening diffuse peribronchial cuffing and patchy interstitial/airspace opacities throughout the lungs bilaterally (  right greater than left), most confluent in the right upper lobe. No definite pleural effusions. Heart size is borderline enlarged. Upper mediastinal contours are within normal limits. IMPRESSION: 1. The appearance the chest suggests progressive  multilobar bronchopneumonia. Electronically Signed   By: Trudie Reed M.D.   On: 01/28/2016 19:23    2-D echo Study Conclusions  - Left ventricle: The cavity size was normal. There was mild   concentric hypertrophy. Systolic function was normal. The   estimated ejection fraction was in the range of 60% to 65%. Wall   motion was normal; there were no regional wall motion   abnormalities. - Aortic valve: There was mild regurgitation. Valve area (VTI):   1.11 cm^2. Valve area (Vmax): 1.08 cm^2. Valve area (Vmean): 1.05   cm^2. - Mitral valve: Mildly calcified annulus. There was moderate   regurgitation. - Left atrium: The atrium was mildly dilated. - Tricuspid valve: There was mild-moderate regurgitation. - Pulmonary arteries: Systolic pressure was mildly increased. PA   peak pressure: 38 mm Hg (S).   Subjective: Dyspnea better. Has occasional cough.  Discharge Exam: Vitals:   01/31/16 2056 02/01/16 0548  BP: (!) 141/65 120/80  Pulse: 96 82  Resp: 18 (!) 21  Temp: 97.9 F (36.6 C) 98.1 F (36.7 C)   Vitals:   01/31/16 1349 01/31/16 2056 02/01/16 0548 02/01/16 0802  BP: 128/86 (!) 141/65 120/80   Pulse: 85 96 82   Resp: 20 18 (!) 21   Temp: 97.7 F (36.5 C) 97.9 F (36.6 C) 98.1 F (36.7 C)   TempSrc:  Oral    SpO2: 94% 95% 94% 94%  Weight:      Height:        Gen: not in distress HEENT:  moist mucosa, supple neck Chest:Scattered rhonchi (improved from yesterday), no crackles CVS: S1 and S2 regular, no murmurs, GI: soft, NT, ND,  Musculoskeletal: warm, no edema, skin tear over her left elbow CNS: Alert and oriented     The results of significant diagnostics from this hospitalization (including imaging, microbiology, ancillary and laboratory) are listed below for reference.     Microbiology: Recent Results (from the past 240 hour(s))  Blood culture (routine x 2)     Status: Abnormal   Collection Time: 01/28/16  2:36 PM  Result Value Ref Range  Status   Specimen Description BLOOD RIGHT ANTECUBITAL  Final   Special Requests BOTTLES DRAWN AEROBIC AND ANAEROBIC 5CC  Final   Culture  Setup Time   Final    GRAM POSITIVE COCCI IN CHAINS AEROBIC BOTTLE ONLY CRITICAL RESULT CALLED TO, READ BACK BY AND VERIFIED WITH: ADagoberto Ligas.D. 10:45 01/29/16 (wilsonm)    Culture (A)  Final    GROUP A STREP (S.PYOGENES) ISOLATED HEALTH DEPARTMENT NOTIFIED    Report Status 01/31/2016 FINAL  Final   Organism ID, Bacteria GROUP A STREP (S.PYOGENES) ISOLATED  Final      Susceptibility   Group a strep (s.pyogenes) isolated - MIC*    ERYTHROMYCIN <=0.12 SENSITIVE Sensitive     TETRACYCLINE <=0.25 SENSITIVE Sensitive     VANCOMYCIN <=0.12 SENSITIVE Sensitive     CLINDAMYCIN <=0.25 SENSITIVE Sensitive     PENICILLIN Value in next row Sensitive      SENSITIVE<=0.06    CEFTRIAXONE Value in next row Sensitive      SENSITIVE<=0.12    * GROUP A STREP (S.PYOGENES) ISOLATED  Blood Culture ID Panel (Reflexed)     Status: Abnormal   Collection Time: 01/28/16  2:36 PM  Result Value Ref Range Status   Enterococcus species NOT DETECTED NOT DETECTED Final   Listeria monocytogenes NOT DETECTED NOT DETECTED Final   Staphylococcus species NOT DETECTED NOT DETECTED Final   Staphylococcus aureus NOT DETECTED NOT DETECTED Final   Streptococcus species DETECTED (A) NOT DETECTED Final    Comment: CRITICAL RESULT CALLED TO, READ BACK BY AND VERIFIED WITH: ADagoberto Ligas.D. 10:45 01/29/16 (wilsonm)    Streptococcus agalactiae NOT DETECTED NOT DETECTED Final   Streptococcus pneumoniae NOT DETECTED NOT DETECTED Final   Streptococcus pyogenes DETECTED (A) NOT DETECTED Final    Comment: CRITICAL RESULT CALLED TO, READ BACK BY AND VERIFIED WITH: ADagoberto Ligas.D. 10:45 01/29/16 (wilsonm)    Acinetobacter baumannii NOT DETECTED NOT DETECTED Final   Enterobacteriaceae species NOT DETECTED NOT DETECTED Final   Enterobacter cloacae complex NOT DETECTED NOT DETECTED  Final   Escherichia coli NOT DETECTED NOT DETECTED Final   Klebsiella oxytoca NOT DETECTED NOT DETECTED Final   Klebsiella pneumoniae NOT DETECTED NOT DETECTED Final   Proteus species NOT DETECTED NOT DETECTED Final   Serratia marcescens NOT DETECTED NOT DETECTED Final   Haemophilus influenzae NOT DETECTED NOT DETECTED Final   Neisseria meningitidis NOT DETECTED NOT DETECTED Final   Pseudomonas aeruginosa NOT DETECTED NOT DETECTED Final   Candida albicans NOT DETECTED NOT DETECTED Final   Candida glabrata NOT DETECTED NOT DETECTED Final   Candida krusei NOT DETECTED NOT DETECTED Final   Candida parapsilosis NOT DETECTED NOT DETECTED Final   Candida tropicalis NOT DETECTED NOT DETECTED Final  Blood culture (routine x 2)     Status: None (Preliminary result)   Collection Time: 01/28/16  2:40 PM  Result Value Ref Range Status   Specimen Description BLOOD RIGHT WRIST  Final   Special Requests BOTTLES DRAWN AEROBIC AND ANAEROBIC 5CC  Final   Culture NO GROWTH 4 DAYS  Final   Report Status PENDING  Incomplete  Respiratory Panel by PCR     Status: Abnormal   Collection Time: 01/29/16 12:28 AM  Result Value Ref Range Status   Adenovirus NOT DETECTED NOT DETECTED Final   Coronavirus 229E NOT DETECTED NOT DETECTED Final   Coronavirus HKU1 NOT DETECTED NOT DETECTED Final   Coronavirus NL63 NOT DETECTED NOT DETECTED Final   Coronavirus OC43 NOT DETECTED NOT DETECTED Final   Metapneumovirus NOT DETECTED NOT DETECTED Final   Rhinovirus / Enterovirus NOT DETECTED NOT DETECTED Final   Influenza A H3 DETECTED (A) NOT DETECTED Final   Influenza B NOT DETECTED NOT DETECTED Final   Parainfluenza Virus 1 NOT DETECTED NOT DETECTED Final   Parainfluenza Virus 2 NOT DETECTED NOT DETECTED Final   Parainfluenza Virus 3 NOT DETECTED NOT DETECTED Final   Parainfluenza Virus 4 NOT DETECTED NOT DETECTED Final   Respiratory Syncytial Virus NOT DETECTED NOT DETECTED Final   Bordetella pertussis NOT DETECTED  NOT DETECTED Final   Chlamydophila pneumoniae NOT DETECTED NOT DETECTED Final   Mycoplasma pneumoniae NOT DETECTED NOT DETECTED Final  MRSA PCR Screening     Status: None   Collection Time: 01/29/16 12:29 AM  Result Value Ref Range Status   MRSA by PCR NEGATIVE NEGATIVE Final    Comment:        The GeneXpert MRSA Assay (FDA approved for NASAL specimens only), is one component of a comprehensive MRSA colonization surveillance program. It is not intended to diagnose MRSA infection nor to guide or monitor treatment for MRSA infections.  Labs: BNP (last 3 results) No results for input(s): BNP in the last 8760 hours. Basic Metabolic Panel:  Recent Labs Lab 01/28/16 1315 01/28/16 1344 01/28/16 1639 01/29/16 0210 01/30/16 0211  NA 134* 135 133* 136 136  K 3.9 5.3* 4.0 3.6 3.6  CL 106 105 103 103 104  CO2 16*  --  18* 22 22  GLUCOSE 143* 138* 180* 110* 126*  BUN 20 31* 20 19 25*  CREATININE 1.07* 0.90 1.07* 1.01* 0.90  CALCIUM 8.5*  --  8.4* 8.2* 8.3*  MG  --   --  2.0  --   --    Liver Function Tests:  Recent Labs Lab 01/28/16 1315 01/28/16 1639  AST 76* 73*  ALT 69* 64*  ALKPHOS 57 54  BILITOT 0.9 1.0  PROT 6.0* 5.9*  ALBUMIN 3.3* 3.1*   No results for input(s): LIPASE, AMYLASE in the last 168 hours. No results for input(s): AMMONIA in the last 168 hours. CBC:  Recent Labs Lab 01/28/16 1315 01/28/16 1344 01/28/16 1639 01/29/16 0210 01/30/16 0211  WBC 16.8*  --  15.5* 17.9* 13.2*  NEUTROABS 15.8*  --  14.5*  --   --   HGB 14.7 15.6* 14.7 14.5 13.3  HCT 42.9 46.0 42.3 42.8 40.8  MCV 94.3  --  94.2 95.1 96.0  PLT 142*  --  139* 143* 142*   Cardiac Enzymes: No results for input(s): CKTOTAL, CKMB, CKMBINDEX, TROPONINI in the last 168 hours. BNP: Invalid input(s): POCBNP CBG: No results for input(s): GLUCAP in the last 168 hours. D-Dimer No results for input(s): DDIMER in the last 72 hours. Hgb A1c No results for input(s): HGBA1C in the last  72 hours. Lipid Profile No results for input(s): CHOL, HDL, LDLCALC, TRIG, CHOLHDL, LDLDIRECT in the last 72 hours. Thyroid function studies No results for input(s): TSH, T4TOTAL, T3FREE, THYROIDAB in the last 72 hours.  Invalid input(s): FREET3 Anemia work up No results for input(s): VITAMINB12, FOLATE, FERRITIN, TIBC, IRON, RETICCTPCT in the last 72 hours. Urinalysis    Component Value Date/Time   COLORURINE STRAW (A) 01/28/2016 2001   APPEARANCEUR CLEAR 01/28/2016 2001   LABSPEC 1.005 01/28/2016 2001   PHURINE 5.0 01/28/2016 2001   GLUCOSEU NEGATIVE 01/28/2016 2001   HGBUR NEGATIVE 01/28/2016 2001   BILIRUBINUR NEGATIVE 01/28/2016 2001   KETONESUR NEGATIVE 01/28/2016 2001   PROTEINUR NEGATIVE 01/28/2016 2001   NITRITE NEGATIVE 01/28/2016 2001   LEUKOCYTESUR NEGATIVE 01/28/2016 2001   Sepsis Labs Invalid input(s): PROCALCITONIN,  WBC,  LACTICIDVEN Microbiology Recent Results (from the past 240 hour(s))  Blood culture (routine x 2)     Status: Abnormal   Collection Time: 01/28/16  2:36 PM  Result Value Ref Range Status   Specimen Description BLOOD RIGHT ANTECUBITAL  Final   Special Requests BOTTLES DRAWN AEROBIC AND ANAEROBIC 5CC  Final   Culture  Setup Time   Final    GRAM POSITIVE COCCI IN CHAINS AEROBIC BOTTLE ONLY CRITICAL RESULT CALLED TO, READ BACK BY AND VERIFIED WITH: ADagoberto Ligas.D. 10:45 01/29/16 (wilsonm)    Culture (A)  Final    GROUP A STREP (S.PYOGENES) ISOLATED HEALTH DEPARTMENT NOTIFIED    Report Status 01/31/2016 FINAL  Final   Organism ID, Bacteria GROUP A STREP (S.PYOGENES) ISOLATED  Final      Susceptibility   Group a strep (s.pyogenes) isolated - MIC*    ERYTHROMYCIN <=0.12 SENSITIVE Sensitive     TETRACYCLINE <=0.25 SENSITIVE Sensitive     VANCOMYCIN <=0.12 SENSITIVE Sensitive  CLINDAMYCIN <=0.25 SENSITIVE Sensitive     PENICILLIN Value in next row Sensitive      SENSITIVE<=0.06    CEFTRIAXONE Value in next row Sensitive       SENSITIVE<=0.12    * GROUP A STREP (S.PYOGENES) ISOLATED  Blood Culture ID Panel (Reflexed)     Status: Abnormal   Collection Time: 01/28/16  2:36 PM  Result Value Ref Range Status   Enterococcus species NOT DETECTED NOT DETECTED Final   Listeria monocytogenes NOT DETECTED NOT DETECTED Final   Staphylococcus species NOT DETECTED NOT DETECTED Final   Staphylococcus aureus NOT DETECTED NOT DETECTED Final   Streptococcus species DETECTED (A) NOT DETECTED Final    Comment: CRITICAL RESULT CALLED TO, READ BACK BY AND VERIFIED WITH: ADagoberto Ligas.D. 10:45 01/29/16 (wilsonm)    Streptococcus agalactiae NOT DETECTED NOT DETECTED Final   Streptococcus pneumoniae NOT DETECTED NOT DETECTED Final   Streptococcus pyogenes DETECTED (A) NOT DETECTED Final    Comment: CRITICAL RESULT CALLED TO, READ BACK BY AND VERIFIED WITH: ADagoberto Ligas.D. 10:45 01/29/16 (wilsonm)    Acinetobacter baumannii NOT DETECTED NOT DETECTED Final   Enterobacteriaceae species NOT DETECTED NOT DETECTED Final   Enterobacter cloacae complex NOT DETECTED NOT DETECTED Final   Escherichia coli NOT DETECTED NOT DETECTED Final   Klebsiella oxytoca NOT DETECTED NOT DETECTED Final   Klebsiella pneumoniae NOT DETECTED NOT DETECTED Final   Proteus species NOT DETECTED NOT DETECTED Final   Serratia marcescens NOT DETECTED NOT DETECTED Final   Haemophilus influenzae NOT DETECTED NOT DETECTED Final   Neisseria meningitidis NOT DETECTED NOT DETECTED Final   Pseudomonas aeruginosa NOT DETECTED NOT DETECTED Final   Candida albicans NOT DETECTED NOT DETECTED Final   Candida glabrata NOT DETECTED NOT DETECTED Final   Candida krusei NOT DETECTED NOT DETECTED Final   Candida parapsilosis NOT DETECTED NOT DETECTED Final   Candida tropicalis NOT DETECTED NOT DETECTED Final  Blood culture (routine x 2)     Status: None (Preliminary result)   Collection Time: 01/28/16  2:40 PM  Result Value Ref Range Status   Specimen Description BLOOD  RIGHT WRIST  Final   Special Requests BOTTLES DRAWN AEROBIC AND ANAEROBIC 5CC  Final   Culture NO GROWTH 4 DAYS  Final   Report Status PENDING  Incomplete  Respiratory Panel by PCR     Status: Abnormal   Collection Time: 01/29/16 12:28 AM  Result Value Ref Range Status   Adenovirus NOT DETECTED NOT DETECTED Final   Coronavirus 229E NOT DETECTED NOT DETECTED Final   Coronavirus HKU1 NOT DETECTED NOT DETECTED Final   Coronavirus NL63 NOT DETECTED NOT DETECTED Final   Coronavirus OC43 NOT DETECTED NOT DETECTED Final   Metapneumovirus NOT DETECTED NOT DETECTED Final   Rhinovirus / Enterovirus NOT DETECTED NOT DETECTED Final   Influenza A H3 DETECTED (A) NOT DETECTED Final   Influenza B NOT DETECTED NOT DETECTED Final   Parainfluenza Virus 1 NOT DETECTED NOT DETECTED Final   Parainfluenza Virus 2 NOT DETECTED NOT DETECTED Final   Parainfluenza Virus 3 NOT DETECTED NOT DETECTED Final   Parainfluenza Virus 4 NOT DETECTED NOT DETECTED Final   Respiratory Syncytial Virus NOT DETECTED NOT DETECTED Final   Bordetella pertussis NOT DETECTED NOT DETECTED Final   Chlamydophila pneumoniae NOT DETECTED NOT DETECTED Final   Mycoplasma pneumoniae NOT DETECTED NOT DETECTED Final  MRSA PCR Screening     Status: None   Collection Time: 01/29/16 12:29 AM  Result Value Ref Range  Status   MRSA by PCR NEGATIVE NEGATIVE Final    Comment:        The GeneXpert MRSA Assay (FDA approved for NASAL specimens only), is one component of a comprehensive MRSA colonization surveillance program. It is not intended to diagnose MRSA infection nor to guide or monitor treatment for MRSA infections.      Time coordinating discharge: Over 30 minutes  SIGNED:   Eddie North, MD  [facility] 02/01/2016, 9:58 AM Pager   If 7PM-7AM, please contact night-coverage [email or web address] Password [email or web address]

## 2016-02-02 LAB — CULTURE, BLOOD (ROUTINE X 2): CULTURE: NO GROWTH

## 2016-02-03 ENCOUNTER — Ambulatory Visit: Payer: Medicare Other | Admitting: Podiatry

## 2016-02-03 ENCOUNTER — Ambulatory Visit: Payer: Medicare Other

## 2016-02-20 NOTE — Progress Notes (Signed)
Cardiology Office Note   Date:  02/23/2016   ID:  Loretta Todd, DOB 10/05/1921, MRN [id number]  PCP:  Delorse Lek, MD  Cardiologist:   Rollene Rotunda, MD    Chief Complaint  Patient presents with  . Atrial Fibrillation      History of Present Illness: Loretta Todd is a 83 y.o. female who presents for follow-up of atrial fibrillation.  The patient has a history of transient AV block while having surgery on her finger.  She did not require a pacemaker and she saw Dr. Mayford Knife n 2015.  She was in the hospital last month with sepsis pneumonia and flu.  She had atrial fib.  I reviewed these records.  Echo during that admission demonstrated NL LV function with TR and mildly elevated pulmonary pressures.  She does not know that she's in this rhythm. She can't feel it. She does feel more fatigued than she have. She is independent and slowly does some household activities. She looks quite vibrant for her age. She's not having any presyncope or syncope. She's not having any chest pressure, neck or arm discomfort. She has no new shortness of breath, PND or orthopnea.  Past Medical History:  Diagnosis Date  . Arthritis   . Asthma   . Lumbar radiculopathy   . Mitral late systolic murmur   . OA (osteoarthritis)    DIPs and PIPs  . Rotator cuff tendinitis     Past Surgical History:  Procedure Laterality Date  . EAR CYST EXCISION Left 06/20/2013   Procedure: DEBRIDE MUCOID CYST/REMOVE LOOSE BODIES LEFT RING FINGER ;  Surgeon: Wyn Forster, MD;  Location: [facility];  Service: Orthopedics;  Laterality: Left;  . EYE SURGERY     both cataracts  . KNEE ARTHROSCOPY W/ MENISCAL REPAIR  2010   right  . TONSILLECTOMY       Current Outpatient Prescriptions  Medication Sig Dispense Refill  . Fluticasone-Salmeterol (ADVAIR) 250-50 MCG/DOSE AEPB Inhale 1 puff into the lungs 2 (two) times daily.     Marland Kitchen ibuprofen (ADVIL,MOTRIN) 200 MG tablet Take 200 mg by mouth 2  (two) times daily.    . polymixin-bacitracin (POLYSPORIN) 500-10000 UNIT/GM OINT ointment Apply 1 application topically 2 (two) times daily. 1 Tube 0  . apixaban (ELIQUIS) 2.5 MG TABS tablet Take 1 tablet (2.5 mg total) by mouth 2 (two) times daily. 60 tablet 11  . metoprolol succinate (TOPROL XL) 25 MG 24 hr tablet Take 1 tablet (25 mg total) by mouth daily. 90 tablet 3   No current facility-administered medications for this visit.     Allergies:   Patient has no known allergies.    ROS:  Please see the history of present illness.   Otherwise, review of systems are positive for none.   All other systems are reviewed and negative.    PHYSICAL EXAM: VS:  BP 110/76   Pulse (!) 112   Ht 5\' 4"  (1.626 m)   Wt 136 lb (61.7 kg)   BMI 23.34 kg/m  , BMI Body mass index is 23.34 kg/m. GENERAL:  Well appearing for her age HEENT:  Pupils equal round and reactive, fundi not visualized, oral mucosa unremarkable NECK:  No jugular venous distention, waveform within normal limits, carotid upstroke brisk and symmetric, no bruits, no thyromegaly LYMPHATICS:  No cervical, inguinal adenopathy LUNGS:  Clear to auscultation bilaterally BACK:  No CVA tenderness CHEST:  Unremarkable HEART:  PMI not displaced or sustained,S1 and  S2 within normal limits, no S3, no clicks, no rubs, 2/6 holosystolic mid left upper sternal border murmur, no diastolic murmurs, irregular ABD:  Flat, positive bowel sounds normal in frequency in pitch, no bruits, no rebound, no guarding, no midline pulsatile mass, no hepatomegaly, no splenomegaly EXT:  2 plus pulses throughout, no edema, no cyanosis no clubbing, skin tears and mild bruising SKIN:  No rashes no nodules NEURO:  Cranial nerves II through XII grossly intact, motor grossly intact throughout PSYCH:  Cognitively intact, oriented to person place and time    EKG:  EKG is ordered today. The ekg ordered today demonstrates atrial fibrillation with left bundle branch block  and left axis deviation   Recent Labs: 01/28/2016: ALT 64; Magnesium 2.0; TSH 0.416 01/30/2016: BUN 25; Creatinine, Ser 0.90; Hemoglobin 13.3; Platelets 142; Potassium 3.6; Sodium 136    Lipid Panel No results found for: CHOL, TRIG, HDL, CHOLHDL, VLDL, LDLCALC, LDLDIRECT    Wt Readings from Last 3 Encounters:  02/21/16 136 lb (61.7 kg)  01/29/16 137 lb 12.6 oz (62.5 kg)  08/02/13 144 lb 12.8 oz (65.7 kg)      Other studies Reviewed: Additional studies/ records that were reviewed today include: Hospital visit.. Review of the above records demonstrates:  Please see elsewhere in the note.     ASSESSMENT AND PLAN:  ATRIAL FIB:  The patient needs anticoagulation. [name] has a CHA2DS2 - VASc score of 4 with a risk of stroke of 4%. Her rate is somewhat fast. Start Toprol-XL 25 g daily. She'll need Eliquis 2.5 mg daily. She'll have a cardioversion in 3 weeks. We'll long discussion about this.  She has no high-risk findings that would preclude anticoagulation. We talked at length about risk benefit.  HISTORY OF AV BLOCK:  She's had no symptomatically arrhythmias. No further therapy is indicated.  CHRONIC LBBB:   This is chronic. I will be very careful with her AV nodal blocking agents.   Current medicines are reviewed at length with the patient today.  The patient does not have concerns regarding medicines.  The following changes have been made:  no change  Labs/ tests ordered today include:   Orders Placed This Encounter  Procedures  . ELECTRICAL CARDIOVERSION  . CBC  . Basic Metabolic Panel (BMET)  . TSH  . INR/PT  . APTT  . EKG 12-Lead     Disposition:   FU with me after the cardioversion.     Signed, Rollene RotundaJames Farren Landa, MD  02/23/2016 8:18 PM    [facility]

## 2016-02-21 ENCOUNTER — Ambulatory Visit (INDEPENDENT_AMBULATORY_CARE_PROVIDER_SITE_OTHER): Payer: Medicare Other | Admitting: Cardiology

## 2016-02-21 ENCOUNTER — Encounter: Payer: Self-pay | Admitting: Cardiology

## 2016-02-21 VITALS — BP 110/76 | HR 112 | Ht 64.0 in | Wt 136.0 lb

## 2016-02-21 DIAGNOSIS — D689 Coagulation defect, unspecified: Secondary | ICD-10-CM | POA: Diagnosis not present

## 2016-02-21 DIAGNOSIS — I4891 Unspecified atrial fibrillation: Secondary | ICD-10-CM | POA: Diagnosis not present

## 2016-02-21 DIAGNOSIS — Z01812 Encounter for preprocedural laboratory examination: Secondary | ICD-10-CM

## 2016-02-21 DIAGNOSIS — R5383 Other fatigue: Secondary | ICD-10-CM | POA: Diagnosis not present

## 2016-02-21 MED ORDER — APIXABAN 2.5 MG PO TABS: 2.5000 mg | ORAL_TABLET | Freq: Two times a day (BID) | ORAL | 11 refills | Status: DC

## 2016-02-21 MED ORDER — METOPROLOL SUCCINATE ER 25 MG PO TB24: 25.0000 mg | ORAL_TABLET | Freq: Every day | ORAL | 3 refills | Status: DC

## 2016-02-21 NOTE — Patient Instructions (Addendum)
Medication Instructions:  START- Eliquis 2.5 mg twice a day and Toprol XL 25 mg daily  Labwork: Pre Op Labs  Testing/Procedures: Your physician has recommended that you have a Cardioversion (DCCV) in 3 weeks. Electrical Cardioversion uses a jolt of electricity to your heart either through paddles or wired patches attached to your chest. This is a controlled, usually prescheduled, procedure. Defibrillation is done under light anesthesia in the hospital, and you usually go home the day of the procedure. This is done to get your heart back into a normal rhythm. You are not awake for the procedure. Please see the instruction sheet given to you today.    Follow-Up: Your physician recommends that you schedule a follow-up appointment in: After Cardioversion   Any Other Special Instructions Will Be Listed Below (If Applicable).   If you need a refill on your cardiac medications before your next appointment, please call your pharmacy.

## 2016-02-23 ENCOUNTER — Encounter: Payer: Self-pay | Admitting: Cardiology

## 2016-02-26 ENCOUNTER — Other Ambulatory Visit: Payer: Self-pay | Admitting: Physician Assistant

## 2016-02-26 DIAGNOSIS — R0602 Shortness of breath: Secondary | ICD-10-CM

## 2016-02-27 ENCOUNTER — Ambulatory Visit
Admission: RE | Admit: 2016-02-27 | Discharge: 2016-02-27 | Disposition: A | Discharge status: Home / Self Care | Payer: BLUE CROSS/BLUE SHIELD | Source: Ambulatory Visit | Attending: Physician Assistant | Admitting: Physician Assistant

## 2016-02-27 DIAGNOSIS — R0602 Shortness of breath: Secondary | ICD-10-CM

## 2016-02-27 MED ORDER — IOPAMIDOL (ISOVUE-370) INJECTION 76%
80.0000 mL | Freq: Once | INTRAVENOUS | Status: AC | PRN
  Administered 2016-02-27: 80 mL via INTRAVENOUS

## 2016-03-09 LAB — BASIC METABOLIC PANEL
BUN: 15 mg/dL (ref 7–25)
CHLORIDE: 108 mmol/L (ref 98–110)
CO2: 28 mmol/L (ref 20–31)
Calcium: 9 mg/dL (ref 8.6–10.4)
Creat: 0.82 mg/dL (ref 0.60–0.88)
Glucose, Bld: 116 mg/dL — ABNORMAL HIGH (ref 65–99)
POTASSIUM: 4.3 mmol/L (ref 3.5–5.3)
SODIUM: 140 mmol/L (ref 135–146)

## 2016-03-09 LAB — CBC
HEMATOCRIT: 42 % (ref 35.0–45.0)
HEMOGLOBIN: 14 g/dL (ref 11.7–15.5)
MCH: 31.7 pg (ref 27.0–33.0)
MCHC: 33.3 g/dL (ref 32.0–36.0)
MCV: 95 fL (ref 80.0–100.0)
MPV: 11.1 fL (ref 7.5–12.5)
Platelets: 179 10*3/uL (ref 140–400)
RBC: 4.42 MIL/uL (ref 3.80–5.10)
RDW: 14 % (ref 11.0–15.0)
WBC: 6.2 10*3/uL (ref 3.8–10.8)

## 2016-03-09 LAB — TSH: TSH: 1.52 m[IU]/L

## 2016-03-09 LAB — PROTIME-INR
INR: 1.1
Prothrombin Time: 11.5 s (ref 9.0–11.5)

## 2016-03-09 LAB — APTT: APTT: 28 s (ref 22–34)

## 2016-03-16 ENCOUNTER — Encounter (HOSPITAL_COMMUNITY): Payer: Self-pay | Admitting: Certified Registered Nurse Anesthetist

## 2016-03-16 ENCOUNTER — Encounter: Payer: Self-pay | Admitting: Cardiology

## 2016-03-16 ENCOUNTER — Ambulatory Visit (HOSPITAL_COMMUNITY)
Admission: RE | Admit: 2016-03-16 | Discharge: 2016-03-16 | Disposition: A | Discharge status: Home / Self Care | Payer: Medicare Other | Source: Ambulatory Visit | Attending: Cardiology | Admitting: Cardiology

## 2016-03-16 ENCOUNTER — Encounter (HOSPITAL_COMMUNITY)
Admission: RE | Disposition: A | Discharge status: Home / Self Care | Payer: Self-pay | Source: Ambulatory Visit | Attending: Cardiology

## 2016-03-16 SURGERY — CARDIOVERSION
Anesthesia: Monitor Anesthesia Care

## 2016-03-16 NOTE — Progress Notes (Unsigned)
Pt scheduled for DCCV; in reviewing apixaban dosing she is taking 2.5 mg BID; she is 89, Cr-0.82 and weight -61.7  Kg. Dose should therefore be 5 mg BID; procedure cancelled. Also recent CT shows RLL mass concerning for malignancy; ? Lymphangitic spread; small right effusion; possible right endobronchial lesion. I will review with Dr Antoine PocheHochrein; it may be best to hold on DCCV altogether until lung mass evaluated. I told pt that Dr Jenene SlickerHochrein's office will contact her with further instructions concerning proceeding with DCCV and apixaban dosing. Olga MillersBrian Crenshaw, MD

## 2016-03-18 ENCOUNTER — Telehealth: Payer: Self-pay | Admitting: Cardiology

## 2016-03-18 NOTE — Telephone Encounter (Signed)
Returned call-states patient was scheduled for DCCV on Monday but was unable to get completed due to needing to be on increased dose of Eliquis.  Reports she spoke to Dr. Antoine PocheHochrein on Monday and was told that it may be rescheduled for this week.  Would like an update of the plan and if Dr. Antoine PocheHochrein wants DCCV this week or not?    Very adamant about getting an answer today.    Advised I am unsure of the plan or if DCCV needs to be rescheduled-will route to Dr. Antoine PocheHochrein for review.

## 2016-03-18 NOTE — Telephone Encounter (Signed)
Spoke with pt Daughter about Cardioversion for Friday March 16th, pt daughter voice understanding

## 2016-03-18 NOTE — Telephone Encounter (Signed)
New Message    Pt was to be rescheduled for cardio aversion?They want this reschedule for this week if possible.

## 2016-03-18 NOTE — Telephone Encounter (Signed)
I have arranged this for 10 AM (DCCV) with Dr[facility] Armanda [facility]raci [facility]  [facility]  She needs to be taking her Eliquis and not have had any interruption.

## 2016-03-20 ENCOUNTER — Encounter (HOSPITAL_COMMUNITY): Payer: Self-pay | Admitting: *Deleted

## 2016-03-20 ENCOUNTER — Ambulatory Visit (HOSPITAL_COMMUNITY): Payer: Medicare Other | Admitting: Anesthesiology

## 2016-03-20 ENCOUNTER — Ambulatory Visit (HOSPITAL_COMMUNITY)
Admission: RE | Admit: 2016-03-20 | Discharge: 2016-03-20 | Disposition: A | Discharge status: Home / Self Care | Payer: Medicare Other | Source: Ambulatory Visit | Attending: Cardiology | Admitting: Cardiology

## 2016-03-20 ENCOUNTER — Encounter (HOSPITAL_COMMUNITY)
Admission: RE | Disposition: A | Discharge status: Home / Self Care | Payer: Self-pay | Source: Ambulatory Visit | Attending: Cardiology

## 2016-03-20 ENCOUNTER — Ambulatory Visit (HOSPITAL_COMMUNITY): Payer: Medicare Other

## 2016-03-20 DIAGNOSIS — Z7951 Long term (current) use of inhaled steroids: Secondary | ICD-10-CM | POA: Diagnosis not present

## 2016-03-20 DIAGNOSIS — Z791 Long term (current) use of non-steroidal anti-inflammatories (NSAID): Secondary | ICD-10-CM | POA: Insufficient documentation

## 2016-03-20 DIAGNOSIS — Z7901 Long term (current) use of anticoagulants: Secondary | ICD-10-CM | POA: Insufficient documentation

## 2016-03-20 DIAGNOSIS — I447 Left bundle-branch block, unspecified: Secondary | ICD-10-CM | POA: Diagnosis not present

## 2016-03-20 DIAGNOSIS — Z79899 Other long term (current) drug therapy: Secondary | ICD-10-CM | POA: Insufficient documentation

## 2016-03-20 DIAGNOSIS — I4891 Unspecified atrial fibrillation: Secondary | ICD-10-CM | POA: Diagnosis present

## 2016-03-20 DIAGNOSIS — J45909 Unspecified asthma, uncomplicated: Secondary | ICD-10-CM | POA: Insufficient documentation

## 2016-03-20 DIAGNOSIS — I481 Persistent atrial fibrillation: Secondary | ICD-10-CM | POA: Diagnosis not present

## 2016-03-20 DIAGNOSIS — Z87891 Personal history of nicotine dependence: Secondary | ICD-10-CM | POA: Diagnosis not present

## 2016-03-20 HISTORY — PX: CARDIOVERSION: SHX1299

## 2016-03-20 LAB — POCT I-STAT 4, (NA,K, GLUC, HGB,HCT)
Glucose, Bld: 93 mg/dL (ref 65–99)
HEMATOCRIT: 41 % (ref 36.0–46.0)
HEMOGLOBIN: 13.9 g/dL (ref 12.0–15.0)
POTASSIUM: 4.4 mmol/L (ref 3.5–5.1)
SODIUM: 142 mmol/L (ref 135–145)

## 2016-03-20 SURGERY — CARDIOVERSION
Anesthesia: General

## 2016-03-20 MED ORDER — MEPERIDINE HCL 100 MG/ML IJ SOLN: 6.2500 mg | INTRAMUSCULAR | Status: DC | PRN

## 2016-03-20 MED ORDER — PROPOFOL 10 MG/ML IV BOLUS
INTRAVENOUS | Status: DC | PRN
Start: 2016-03-20 — End: 2016-03-20
  Administered 2016-03-20: 10 mg via INTRAVENOUS
  Administered 2016-03-20 (×2): 30 mg via INTRAVENOUS
  Administered 2016-03-20: 20 mg via INTRAVENOUS

## 2016-03-20 MED ORDER — ONDANSETRON HCL 4 MG/2ML IJ SOLN: 4.0000 mg | Freq: Once | INTRAMUSCULAR | Status: DC | PRN

## 2016-03-20 MED ORDER — OXYCODONE HCL 5 MG/5ML PO SOLN: 5.0000 mg | Freq: Once | ORAL | Status: DC | PRN

## 2016-03-20 MED ORDER — LIDOCAINE HCL (CARDIAC) 20 MG/ML IV SOLN
INTRAVENOUS | Status: DC | PRN
  Administered 2016-03-20: 50 mg via INTRAVENOUS

## 2016-03-20 MED ORDER — ACETAMINOPHEN 160 MG/5ML PO SOLN: 325.0000 mg | ORAL | Status: DC | PRN

## 2016-03-20 MED ORDER — SODIUM CHLORIDE 0.9 % IV SOLN
INTRAVENOUS | Status: DC
Start: 2016-03-20 — End: 2016-03-20
  Administered 2016-03-20: 09:00:00 via INTRAVENOUS

## 2016-03-20 MED ORDER — OXYCODONE HCL 5 MG PO TABS: 5.0000 mg | ORAL_TABLET | Freq: Once | ORAL | Status: DC | PRN

## 2016-03-20 MED ORDER — ACETAMINOPHEN 325 MG PO TABS: 325.0000 mg | ORAL_TABLET | ORAL | Status: DC | PRN

## 2016-03-20 NOTE — Anesthesia Preprocedure Evaluation (Signed)
Anesthesia Evaluation  Patient identified by MRN, date of birth, ID band Patient awake    Reviewed: Allergy & Precautions, NPO status , Patient's Chart, lab work & pertinent test results  Airway Mallampati: I       Dental no notable dental hx.    Pulmonary former smoker,    Pulmonary exam normal        Cardiovascular Normal cardiovascular exam     Neuro/Psych negative psych ROS   GI/Hepatic negative GI ROS, Neg liver ROS,   Endo/Other  negative endocrine ROS  Renal/GU negative Renal ROS     Musculoskeletal   Abdominal Normal abdominal exam  (+)   Peds  Hematology negative hematology ROS (+)   Anesthesia Other Findings   Reproductive/Obstetrics                             Anesthesia Physical Anesthesia Plan  ASA: II  Anesthesia Plan: General   Post-op Pain Management:    Induction: Intravenous  Airway Management Planned: Natural Airway and Simple Face Mask  Additional Equipment:   Intra-op Plan:   Post-operative Plan:   Informed Consent: I have reviewed the patients History and Physical, chart, labs and discussed the procedure including the risks, benefits and alternatives for the proposed anesthesia with the patient or authorized representative who has indicated his/her understanding and acceptance.     Plan Discussed with: CRNA  Anesthesia Plan Comments:         Anesthesia Quick Evaluation

## 2016-03-20 NOTE — Transfer of Care (Signed)
Immediate Anesthesia Transfer of Care Note  Patient: Loretta Todd  Procedure(s) Performed: Procedure(s): TRANSESOPHAGEAL ECHOCARDIOGRAM (TEE) (N/A) CARDIOVERSION (N/A)  Patient Location: Endoscopy Unit  Anesthesia Type:MAC  Level of Consciousness: awake, oriented, sedated, patient cooperative and responds to stimulation  Airway & Oxygen Therapy: Patient Spontanous Breathing and Patient connected to nasal cannula oxygen  Post-op Assessment: Report given to RN, Post -op Vital signs reviewed and stable, Patient moving all extremities and Patient moving all extremities X 4  Post vital signs: Reviewed and stable  Last Vitals:  Vitals:   03/20/16 0843  BP: 129/76  Pulse: 82  Resp: (!) 23  Temp: 36.6 C    Last Pain:  Vitals:   03/20/16 0843  TempSrc: Oral         Complications: No apparent anesthesia complications

## 2016-03-20 NOTE — CV Procedure (Signed)
    PROCEDURE NOTE:  Procedure:  Transesophageal echocardiogram Operator:  Armanda Magicraci Shelvy Heckert, MD Indications:  Atrial fibrillation Complications: None  During this procedure the patient is administered a total of 90 mg of propofol and 50mg  Lidocaine were used to achieve and maintain sedation.  The patient's heart rate, blood pressure, and oxygen saturation are monitored continuously during the procedure.   Attempts were made at intubating the esophagus with the TEE probe but could not safely pass the probe.  I spoke with her daughter who states that patient's wishes were to proceed with DCCV knowing risks involved so will proceed with DCCV.    Electrical Cardioversion Procedure Note Randa Spikeleanor S Maharaj [id number] [birth year]  Procedure: Electrical Cardioversion Indications:  Atrial Fibrillation  Time Out: Verified patient identification, verified procedure,medications/allergies/relevent history reviewed, required imaging and test results available.  Performed  Procedure Details  The patient was NPO after midnight. Anesthesia was administered at the beside  by Dr.Hatcher  Cardioversion was done with synchronized biphasic defibrillation with AP pads with 150watts.  The patient converted to normal sinus rhythm. The patient tolerated the procedure well   IMPRESSION:  Successful cardioversion of atrial fibrillation    Admiral Marcucci 03/20/2016, 9:57 AM

## 2016-03-20 NOTE — H&P (View-Only) (Signed)
Cardiology Office Note   Date:  02/23/2016   ID:  Loretta Todd, DOB 10/05/1921, MRN [id number]  PCP:  Delorse Lek, MD  Cardiologist:   Rollene Rotunda, MD    Chief Complaint  Patient presents with  . Atrial Fibrillation      History of Present Illness: Loretta Todd is a 83 y.o. female who presents for follow-up of atrial fibrillation.  The patient has a history of transient AV block while having surgery on her finger.  She did not require a pacemaker and she saw Dr. Mayford Knife n 2015.  She was in the hospital last month with sepsis pneumonia and flu.  She had atrial fib.  I reviewed these records.  Echo during that admission demonstrated NL LV function with TR and mildly elevated pulmonary pressures.  She does not know that she's in this rhythm. She can't feel it. She does feel more fatigued than she have. She is independent and slowly does some household activities. She looks quite vibrant for her age. She's not having any presyncope or syncope. She's not having any chest pressure, neck or arm discomfort. She has no new shortness of breath, PND or orthopnea.  Past Medical History:  Diagnosis Date  . Arthritis   . Asthma   . Lumbar radiculopathy   . Mitral late systolic murmur   . OA (osteoarthritis)    DIPs and PIPs  . Rotator cuff tendinitis     Past Surgical History:  Procedure Laterality Date  . EAR CYST EXCISION Left 06/20/2013   Procedure: DEBRIDE MUCOID CYST/REMOVE LOOSE BODIES LEFT RING FINGER ;  Surgeon: Wyn Forster, MD;  Location: [facility];  Service: Orthopedics;  Laterality: Left;  . EYE SURGERY     both cataracts  . KNEE ARTHROSCOPY W/ MENISCAL REPAIR  2010   right  . TONSILLECTOMY       Current Outpatient Prescriptions  Medication Sig Dispense Refill  . Fluticasone-Salmeterol (ADVAIR) 250-50 MCG/DOSE AEPB Inhale 1 puff into the lungs 2 (two) times daily.     Marland Kitchen ibuprofen (ADVIL,MOTRIN) 200 MG tablet Take 200 mg by mouth 2  (two) times daily.    . polymixin-bacitracin (POLYSPORIN) 500-10000 UNIT/GM OINT ointment Apply 1 application topically 2 (two) times daily. 1 Tube 0  . apixaban (ELIQUIS) 2.5 MG TABS tablet Take 1 tablet (2.5 mg total) by mouth 2 (two) times daily. 60 tablet 11  . metoprolol succinate (TOPROL XL) 25 MG 24 hr tablet Take 1 tablet (25 mg total) by mouth daily. 90 tablet 3   No current facility-administered medications for this visit.     Allergies:   Patient has no known allergies.    ROS:  Please see the history of present illness.   Otherwise, review of systems are positive for none.   All other systems are reviewed and negative.    PHYSICAL EXAM: VS:  BP 110/76   Pulse (!) 112   Ht 5\' 4"  (1.626 m)   Wt 136 lb (61.7 kg)   BMI 23.34 kg/m  , BMI Body mass index is 23.34 kg/m. GENERAL:  Well appearing for her age HEENT:  Pupils equal round and reactive, fundi not visualized, oral mucosa unremarkable NECK:  No jugular venous distention, waveform within normal limits, carotid upstroke brisk and symmetric, no bruits, no thyromegaly LYMPHATICS:  No cervical, inguinal adenopathy LUNGS:  Clear to auscultation bilaterally BACK:  No CVA tenderness CHEST:  Unremarkable HEART:  PMI not displaced or sustained,S1 and  S2 within normal limits, no S3, no clicks, no rubs, 2/6 holosystolic mid left upper sternal border murmur, no diastolic murmurs, irregular ABD:  Flat, positive bowel sounds normal in frequency in pitch, no bruits, no rebound, no guarding, no midline pulsatile mass, no hepatomegaly, no splenomegaly EXT:  2 plus pulses throughout, no edema, no cyanosis no clubbing, skin tears and mild bruising SKIN:  No rashes no nodules NEURO:  Cranial nerves II through XII grossly intact, motor grossly intact throughout PSYCH:  Cognitively intact, oriented to person place and time    EKG:  EKG is ordered today. The ekg ordered today demonstrates atrial fibrillation with left bundle branch block  and left axis deviation   Recent Labs: 01/28/2016: ALT 64; Magnesium 2.0; TSH 0.416 01/30/2016: BUN 25; Creatinine, Ser 0.90; Hemoglobin 13.3; Platelets 142; Potassium 3.6; Sodium 136    Lipid Panel No results found for: CHOL, TRIG, HDL, CHOLHDL, VLDL, LDLCALC, LDLDIRECT    Wt Readings from Last 3 Encounters:  02/21/16 136 lb (61.7 kg)  01/29/16 137 lb 12.6 oz (62.5 kg)  08/02/13 144 lb 12.8 oz (65.7 kg)      Other studies Reviewed: Additional studies/ records that were reviewed today include: Hospital visit.. Review of the above records demonstrates:  Please see elsewhere in the note.     ASSESSMENT AND PLAN:  ATRIAL FIB:  The patient needs anticoagulation. Loretta Todd has a CHA2DS2 - VASc score of 4 with a risk of stroke of 4%. Her rate is somewhat fast. Start Toprol-XL 25 g daily. She'll need Eliquis 2.5 mg daily. She'll have a cardioversion in 3 weeks. We'll long discussion about this.  She has no high-risk findings that would preclude anticoagulation. We talked at length about risk benefit.  HISTORY OF AV BLOCK:  She's had no symptomatically arrhythmias. No further therapy is indicated.  CHRONIC LBBB:   This is chronic. I will be very careful with her AV nodal blocking agents.   Current medicines are reviewed at length with the patient today.  The patient does not have concerns regarding medicines.  The following changes have been made:  no change  Labs/ tests ordered today include:   Orders Placed This Encounter  Procedures  . ELECTRICAL CARDIOVERSION  . CBC  . Basic Metabolic Panel (BMET)  . TSH  . INR/PT  . APTT  . EKG 12-Lead     Disposition:   FU with me after the cardioversion.     Signed, Rollene RotundaJames Erricka Falkner, MD  02/23/2016 8:18 PM    [facility]

## 2016-03-20 NOTE — Interval H&P Note (Signed)
History and Physical Interval Note:  03/20/2016 9:33 AM  Loretta Todd  has presented today for surgery, with the diagnosis of a fib  The various methods of treatment have been discussed with the patient and family. After consideration of risks, benefits and other options for treatment, the patient has consented to  Procedure(s): TRANSESOPHAGEAL ECHOCARDIOGRAM (TEE) (N/A) CARDIOVERSION (N/A) as a surgical intervention .  The patient's history has been reviewed, patient examined, no change in status, stable for surgery.  I have reviewed the patient's chart and labs.  Questions were answered to the patient's satisfaction.     Armanda Magicraci [name]

## 2016-03-20 NOTE — Discharge Instructions (Signed)
Electrical Cardioversion, Care After °This sheet gives you information about how to care for yourself after your procedure. Your health care provider may also give you more specific instructions. If you have problems or questions, contact your health care provider. °What can I expect after the procedure? °After the procedure, it is common to have: °· Some redness on the skin where the shocks were given. °Follow these instructions at home: °· Do not drive for 24 hours if you were given a medicine to help you relax (sedative). °· Take over-the-counter and prescription medicines only as told by your health care provider. °· Ask your health care provider how to check your pulse. Check it often. °· Rest for 48 hours after the procedure or as told by your health care provider. °· Avoid or limit your caffeine use as told by your health care provider. °Contact a health care provider if: °· You feel like your heart is beating too quickly or your pulse is not regular. °· You have a serious muscle cramp that does not go away. °Get help right away if: °· You have discomfort in your chest. °· You are dizzy or you feel faint. °· You have trouble breathing or you are short of breath. °· Your speech is slurred. °· You have trouble moving an arm or leg on one side of your body. °· Your fingers or toes turn cold or blue. °This information is not intended to replace advice given to you by your health care provider. Make sure you discuss any questions you have with your health care provider. °Document Released: 10/12/2012 Document Revised: 07/26/2015 Document Reviewed: 06/28/2015 °Elsevier Interactive Patient Education © 2017 [employer] ° °

## 2016-03-20 NOTE — Interval H&P Note (Signed)
History and Physical Interval Note:  03/20/2016 9:48 AM  Loretta Todd  has presented today for surgery, with the diagnosis of a fib  The various methods of treatment have been discussed with the patient and family. After consideration of risks, benefits and other options for treatment, the patient has consented to  Procedure(s): TRANSESOPHAGEAL ECHOCARDIOGRAM (TEE) (N/A) CARDIOVERSION (N/A) as a surgical intervention .  The patient's history has been reviewed, patient examined, no change in status, stable for surgery.  I have reviewed the patient's chart and labs.  Questions were answered to the patient's satisfaction.    I was contacted by Dr. Antoine PocheHochrein on Wed 3/14 concerning patients cardioversion.  She has been set up for DCCV a week ago but there was concern over her having a DCCV on Eliquis 2.5mg  BID.  She is over 82 but weight has been teetering around 60kg which is the cut off for the lower dose of 2.5mg  BID.  Dr. Antoine PocheHochrein voiced concern of having her on the higher dose give her advanced age and increased risk for bleeding if her weight dropped below 60kg.  He told me that he discussed this at length with the daughter and patient.  The patient was adamant that she wanted to proceed and understood the increased risk of a stroke.    Today patient is doing well.  I discussed at length the procedure again with the patient and the daughter.  They understand that she is on the borderline in regards to dosing of Eliquis and that there is risk of increased bleeding on higher dose of eliquis and also increased risk of CVA after DCCV on the lower dose of Eliquis because her weight is on the border.  The patient again stated that she understands the risk of CVA and fully accepts the risk.  She wants to proceed with DCCV.  I recommended that we try to do a TEE prior to DCCV to document that there is no evidence of obvious clot.  I asked her if we were unable to intubate the esophagus with the probe to  document would she want to proceed with DCCV and she said yes.     Loretta Todd

## 2016-03-20 NOTE — Anesthesia Postprocedure Evaluation (Addendum)
Anesthesia Post Note  Patient: Loretta Todd  Procedure(s) Performed: Procedure(s) (LRB): CARDIOVERSION (N/A)  Patient location during evaluation: PACU Anesthesia Type: General Level of consciousness: awake and sedated Pain management: pain level controlled Vital Signs Assessment: post-procedure vital signs reviewed and stable Respiratory status: spontaneous breathing Cardiovascular status: stable Postop Assessment: no signs of nausea or vomiting Anesthetic complications: no        Last Vitals:  Vitals:   03/20/16 0843 03/20/16 1005  BP: 129/76 100/61  Pulse: 82 71  Resp: (!) 23   Temp: 36.6 C 36.5 C    Last Pain:  Vitals:   03/20/16 1005  TempSrc: Oral   Pain Goal:                 Tomasa Dobransky JR,JOHN Willodean Leven

## 2016-03-22 ENCOUNTER — Encounter (HOSPITAL_COMMUNITY): Payer: Self-pay | Admitting: Cardiology

## 2016-03-24 ENCOUNTER — Encounter: Payer: Self-pay | Admitting: Internal Medicine

## 2016-03-24 ENCOUNTER — Ambulatory Visit (INDEPENDENT_AMBULATORY_CARE_PROVIDER_SITE_OTHER): Payer: Medicare Other | Admitting: Internal Medicine

## 2016-03-24 ENCOUNTER — Ambulatory Visit (INDEPENDENT_AMBULATORY_CARE_PROVIDER_SITE_OTHER)
Admission: RE | Admit: 2016-03-24 | Discharge: 2016-03-24 | Disposition: A | Discharge status: Home / Self Care | Payer: Medicare Other | Source: Ambulatory Visit | Attending: Internal Medicine | Admitting: Internal Medicine

## 2016-03-24 VITALS — BP 120/82 | HR 79 | Ht 64.0 in | Wt 123.0 lb

## 2016-03-24 DIAGNOSIS — J449 Chronic obstructive pulmonary disease, unspecified: Secondary | ICD-10-CM

## 2016-03-24 DIAGNOSIS — J181 Lobar pneumonia, unspecified organism: Secondary | ICD-10-CM

## 2016-03-24 DIAGNOSIS — J9601 Acute respiratory failure with hypoxia: Secondary | ICD-10-CM | POA: Diagnosis not present

## 2016-03-24 MED ORDER — BUDESONIDE-FORMOTEROL FUMARATE 80-4.5 MCG/ACT IN AERO: 2.0000 | INHALATION_SPRAY | Freq: Two times a day (BID) | RESPIRATORY_TRACT | 0 refills | Status: DC

## 2016-03-24 MED ORDER — FLUTICASONE FUROATE-VILANTEROL 100-25 MCG/INH IN AEPB: 1.0000 | INHALATION_SPRAY | Freq: Every day | RESPIRATORY_TRACT | 0 refills | Status: DC

## 2016-03-24 MED ORDER — FLUTICASONE FUROATE-VILANTEROL 100-25 MCG/INH IN AEPB: 1.0000 | INHALATION_SPRAY | Freq: Every morning | RESPIRATORY_TRACT | 11 refills | Status: DC

## 2016-03-24 NOTE — Patient Instructions (Addendum)
Stop advair and start Breo one each am - take two good drags but open/click just once  Please remember to go to the  x-ray department downstairs in the basement  for your tests - we will call you with the results when they are available.  Please schedule a follow up office visit in 4 weeks, sooner if needed

## 2016-03-24 NOTE — Progress Notes (Signed)
Spoke with Cardell PeachGay, pt's daughter and notified of results

## 2016-03-24 NOTE — Progress Notes (Signed)
Subjective:     Patient ID: Loretta Todd, female   DOB: May 17, 1921,    MRN: [id number]  HPI   [id number]  Quit smoking 1998 s resp symptoms and played tennis until age 83 while of advair 250 bid  then acutely in Janurary 2018     Admit date: 01/28/2016 Discharge date: 02/01/2016  Admitted From: Home Disposition: Home  Recommendations for Outpatient Follow-up:  1. Follow up with PCP in 1-2 weeks 2. Patient will complete 7 day course of antibiotics on 1/31  and Tamiflu on 1/29. 3. Patient is being discharged on 2 L via nasal cannula continuously.  Home Health: RN and PT Equipment/Devices: Oxygen (2 L via nasal cannula)     Discharge Diagnoses:  Principal Problem:   Severe sepsis (HCC)   Active Problems:   LBBB (left bundle branch block)   Atrial fibrillation (HCC)   Acute respiratory failure with hypoxia (HCC)   Elbow laceration, right, initial encounter   Lobar pneumonia (HCC)  Brief narrative/history of present illness 83 year old female with history of asthma, osteoarthritis, transient third-degree AV block (transient following finger surgery in 2015 with no recurrence) chronic left bundle-branch block (negative for ischemia on stress test in 2004) who presented to the ED with chills with weakness, increasing shortness of breath worse with exertion and cough and lightheadedness for past few days. In the ED she was septic with hypotension, tachycardia and tachypnea with leukocytosis (WBC 17 K) chest x-ray showed multifocal pneumonia. Lactic acid was elevated. Admitted to stepdown unit for further management. Patient received IV fluid bolus for sepsis protocol in the ED following which she went into acute pulmonary edema. Improved after stopping fluids and given IV Lasix.  Hospital course  Principal Problem: Severe sepsis (HCC) Secondary to multilobar pneumonia and influenza A. Sepsis is resolved. Blood culture on admission growing Streptococcus species.  Added antibiotics. Will discharged on oral Levaquin to complete a seven-day course.  ContinueTamiflu (completes 5 day course after 1/28). Supportive care with Tylenol and antitussives.   Active Problems: New onset atrial fibrillation with RVR Secondary to sepsis.. TSH normal.  2-D echo shows EF of 60-65% with normal wall motion, mild AR and moderate MR. Mildly increased PA pressure (38 mmHg) Stable on monitor past 48 hours occasional heart rate in 120s. Monitor during outpatient follow-up.   Acute respiratory failure with hypoxia (HCC) Secondary to lobar pneumoniaand influenza. Plan as above. Evaluated for home O2 and patient requiring 2 L via nasal cannula both at rest and ambulation.  LBBB (left bundle branch block) Chronic.  Prolonged QTC Avoid QT prolonging agents.  Generalized weakness. Seen by PT and recommend home health    03/24/2016 1st [facility] office visit/ Ashle Stief   Chief Complaint  Patient presents with  . Pulmonary Consult    Referred by Rueben BashBreejante Williams, PA. Pt states had PNA and Flu in Jan 2018 and has had SOB since then. She states she had severe asthma as a child, and then outgrew this as a teen.   doe = MMRC2 = can't walk a nl pace on a flat grade s sob but does fine slow and flat eg HT but not [employer] fatigue and sob both limit about the same time  Also am cough/ wheeze yellow to brown sputum x sev weeks prior to OV     No obvious day to day or daytime variability or assoc excess/ purulent sputum or mucus plugs or hemoptysis or cp or chest tightness, subjective wheeze or overt sinus or  hb symptoms. No unusual exp hx or h/o childhood pna/ asthma or knowledge of premature birth.  Sleeping ok without nocturnal  or early am exacerbation  of respiratory  c/o's or need for noct saba. Also denies any obvious fluctuation of symptoms with weather or environmental changes or other aggravating or alleviating factors except as outlined above    Current Medications, Allergies, Complete Past Medical History, Past Surgical History, Family History, and Social History were reviewed in [employer] record.  ROS  The following are not active complaints unless bolded sore throat, dysphagia, dental problems, itching, sneezing,  nasal congestion or excess/ purulent secretions, ear ache,   fever, chills, sweats, unintended wt loss, classically pleuritic or exertional cp,  orthopnea pnd or leg swelling, presyncope, palpitations, abdominal pain, anorexia, nausea, vomiting, diarrhea  or change in bowel or bladder habits, change in stools or urine, dysuria,hematuria,  rash, arthralgias, visual complaints, headache, numbness, weakness or ataxia or problems with walking or coordination,  change in mood/affect or memory.         Review of Systems     Objective:   Physical Exam    amb pleasantly cantankerous thin wf nad   HEENT: nl dentition, turbinates bilaterally, and oropharynx. Nl external ear canals without cough reflex   NECK :  without JVD/Nodes/TM/ nl carotid upstrokes bilaterally   LUNGS: no acc muscle use,  Nl contour chest which is clear to A and P bilaterally without cough on insp or exp maneuvers   CV:  RRR  no s3 or murmur or increase in P2, and no edema   ABD:  soft and nontender with nl inspiratory excursion in the supine position. No bruits or organomegaly appreciated, bowel sounds nl  MS:  Nl gait/ ext warm without deformities, calf tenderness, cyanosis or clubbing No obvious joint restrictions   SKIN: warm and dry without lesions    NEURO:  alert, approp, nl sensorium with  no motor or cerebellar deficits apparent.    CTa 02/27/16 No evidence of pulmonary embolus.  Upper lobe predominant moderate to severe emphysema.  Spiculated peribronchial soft tissue mass in the right lower lobe measuring 2.2 cm in greatest dimension. Irregular peribronchial thickening along the right upper lobe  bronchi. These findings are suspicious for primary pulmonary malignancy. Less likely they may represent inflammatory masses.  Diffuse interstitial thickening of the right upper lobe more than right middle, right lower and left upper lobes. Lymphangitic spread of malignancy versus inflammatory changes.  Scarring versus pulmonary masses in the right upper lobe and left upper lobe.  Questionable endobronchial lesion versus volume averaging artifact in the right mainstem bronchus.  Atelectasis versus airspace consolidation in the dependent portion of the right upper lobe.  Small right pleural effusion.    CXR PA and Lateral:   03/24/2016 :    I personally reviewed images and agree with radiology impression as follows:    1. Partial clearing of right upper lobe infiltrate. 2. Cardiomegaly with pulmonary vascular prominence and bilateral interstitial prominence suggesting CHF.     Assessment:

## 2016-03-25 NOTE — Assessment & Plan Note (Signed)
03/24/2016   Walked RA  2 laps @ 185 ft each stopped due to  Feeling tired > sob/ sats still 90%  No role for 02 here

## 2016-03-25 NOTE — Assessment & Plan Note (Signed)
Quit smoking 1998 - Spirometry 03/24/2016  FEV1 1.27 (85%)  Ratio 64  - 03/24/2016  After extensive coaching HFA effectiveness =    0 % but 90% with dpi so rec change to BREO one click each am     When respiratory symptoms begin or become refractory well after a patient reports complete smoking cessation,  Especially when this wasn't the case while they were smoking, a red flag is raised based on the work of Dr Primitivo GauzeFletcher which states:  if you quit smoking when your best day FEV1 is still well preserved it is highly unlikely you will progress to severe disease.  That is to say, once the smoking stops,  the symptoms should not suddenly erupt or markedly worsen.  If so, the differential diagnosis should include  obesity/deconditioning,  LPR/Reflux/Aspiration syndromes,  occult CHF, or  especially side effect of medications commonly used in this population.  In her case also concerned about airway involvement with tumor (see separate a/p)   For now rec just rx with BREO and f/u in one month   Total time devoted to counseling  > 50 % of initial 60 min office visit:  review case with pt/ discussion of options/alternatives/ personally creating written customized instructions  in presence of pt  then going over those specific  Instructions directly with the pt including how to use all of the meds but in particular covering each new medication in detail and the difference between the maintenance= "automatic" meds and the prns using an action plan format for the latter (If this problem/symptom => do that organization reading Left to right).  Please see AVS from this visit for a full list of these instructions which I personally wrote for this pt and  are unique to this visit.

## 2016-03-25 NOTE — Assessment & Plan Note (Signed)
See cta 02/27/16 > partial clearing on cxr 03/24/2016   She is only now 4 weeks out from pna so no need to be aggressive here at age 83 as it will take a lot longer to heal given age and underlying copd but ultimately may need fob based on CT findings   Discussed in detail all the  indications, usual  risks and alternatives  relative to the benefits with patient who agrees to proceed with conservative f/u as outlined

## 2016-03-26 ENCOUNTER — Institutional Professional Consult (permissible substitution): Payer: Medicare Other | Admitting: Pulmonary Disease

## 2016-03-27 ENCOUNTER — Telehealth: Payer: Self-pay | Admitting: Cardiology

## 2016-03-27 NOTE — Telephone Encounter (Signed)
No other recommendations

## 2016-03-27 NOTE — Telephone Encounter (Signed)
Returned call to daughter-reports she was seen by pulmonology yesterday and was told she is back in Afib.  Patient had DCCV on 3/16.  HR yesterday at appointment was 79 BP 120/82.  Denies symptoms-reports patient is not aware of being in Afib.    F/u appointment made with Dr. Antoine PocheHochrein on 3/28 at 3:45pm.  Patient currently on eliquis and toprol-advised to continue and call if patient becomes symptomatic.    Routed to MD for further recommendations.  Daughter and patient aware and verbalized understanding.

## 2016-03-27 NOTE — Telephone Encounter (Signed)
New Message    Pt was at Pulmonary doctor yesterday and they said she was in AFIB again   They also have questions on her medication

## 2016-03-31 NOTE — Progress Notes (Signed)
Cardiology Office Note   Date:  04/01/2016   ID:  [name], DOB 09/07/1921, MRN [id number]  PCP:  Delorse Lek, MD  Cardiologist:   Rollene Rotunda, MD    Chief Complaint  Patient presents with  . Atrial Fibrillation      History of Present Illness: Loretta Todd is a 83 y.o. female who presents for follow-up of atrial fibrillation.  The patient has a history of transient AV block while having surgery on her finger.  She did not require a pacemaker and she saw Dr. Mayford Knife n 2015.  She was in the hospital with flu recently and had atrial fib.  She underwent cardioversion.   However, she called because she thought that she was back in atrial fib.  She was seen in the pulmonary office and thought to be back in fib.  I don't see an EKG.  She does not feel any different.  She does not notice palpitations, syncope or presyncope.  She denies any chest pain  Past Medical History:  Diagnosis Date  . Arthritis   . Asthma   . Lumbar radiculopathy   . Mitral late systolic murmur   . OA (osteoarthritis)    DIPs and PIPs  . Rotator cuff tendinitis     Past Surgical History:  Procedure Laterality Date  . CARDIOVERSION N/A 03/20/2016   Procedure: CARDIOVERSION;  Surgeon: Quintella Reichert, MD;  Location: [facility];  Service: Cardiovascular;  Laterality: N/A;  . EAR CYST EXCISION Left 06/20/2013   Procedure: DEBRIDE MUCOID CYST/REMOVE LOOSE BODIES LEFT RING FINGER ;  Surgeon: Wyn Forster, MD;  Location: [facility];  Service: Orthopedics;  Laterality: Left;  . EYE SURGERY     both cataracts  . KNEE ARTHROSCOPY W/ MENISCAL REPAIR  2010   right  . TONSILLECTOMY       Current Outpatient Prescriptions  Medication Sig Dispense Refill  . apixaban (ELIQUIS) 2.5 MG TABS tablet Take 1 tablet (2.5 mg total) by mouth 2 (two) times daily. 60 tablet 11  . fluticasone furoate-vilanterol (BREO ELLIPTA) 100-25 MCG/INH AEPB Inhale 1 puff into the lungs every  morning. 28 each 11  . metoprolol succinate (TOPROL XL) 25 MG 24 hr tablet Take 1 tablet (25 mg total) by mouth daily. 90 tablet 3  . OXYGEN Pt uses o2 with sleep but she does not know what liter flow  [facility]    . polymixin-bacitracin (POLYSPORIN) 500-10000 UNIT/GM OINT ointment Apply 1 application topically 2 (two) times daily. 1 Tube 0   No current facility-administered medications for this visit.     Allergies:   Patient has no known allergies.    ROS:  Please see the history of present illness.   Otherwise, review of systems are positive for none.   All other systems are reviewed and negative.    PHYSICAL EXAM: VS:  BP 140/88   Pulse 87   Ht 5\' 4"  (1.626 m)   Wt 135 lb (61.2 kg)   BMI 23.17 kg/m  , BMI Body mass index is 23.17 kg/m. GENERAL:  Well appearing for her age NECK:  No jugular venous distention, waveform within normal limits, carotid upstroke brisk and symmetric, no bruits, no thyromegaly  LUNGS:  Clear to auscultation bilaterally.   BACK:  No CVA tenderness CHEST:  Unremarkable HEART:  PMI not displaced or sustained,S1 and S2 within normal limits, no S3, no S4, no clicks, no rubs, 2/6 holosystolic mid left upper sternal border  murmur, no diastolic murmurs, regular ABD:  Flat, positive bowel sounds normal in frequency in pitch, no bruits, no rebound, no guarding, no midline pulsatile mass, no hepatomegaly, no splenomegaly EXT:  2 plus pulses throughout, no edema, no cyanosis no clubbing.      EKG:  EKG is  ordered today. The ekg ordered today demonstrates Normal sinus rhythm, rate 78, axis leftward, intervals within normal limits, no acute ST-T wave changes.   Recent Labs: 01/28/2016: ALT 64; Magnesium 2.0 03/09/2016: BUN 15; Creat 0.82; Platelets 179; TSH 1.52 03/20/2016: Hemoglobin 13.9; Potassium 4.4; Sodium 142    Lipid Panel No results found for: CHOL, TRIG, HDL, CHOLHDL, VLDL, LDLCALC, LDLDIRECT    Wt Readings from Last 3 Encounters:  04/01/16 135 lb  (61.2 kg)  03/24/16 123 lb (55.8 kg)  03/20/16 136 lb (61.7 kg)      Other studies Reviewed: Additional studies/ records that were reviewed today include: Pulm records Review of the above records demonstrates:      ASSESSMENT AND PLAN:  ATRIAL FIB:   Ms. Loretta Todd has a CHA2DS2 - VASc score of 4 with a risk of stroke of 4%.  She underwent cardioversion.  Today she is in NSR with rare ectopy.  I don't see recurrent atrial fib.  Regardless therapy was not changed. Beta blocker and anticoagulant. Of note she's on a lower dose because she is very close to 60 kg and is [age] years old. I have explained to the patient and her family that this is a lower dose I think was suggested that they prefer this.  HISTORY OF AV BLOCK:   She has had no symptoms.  No change in therapy is indicated.   CHRONIC LBBB:     This is chronic. I will be very careful with her AV nodal blocking agents.  She is tolerating the current beta blocker.  No change in therapy is planned   Current medicines are reviewed at length with the patient today.  The patient does not have concerns regarding medicines.  The following changes have been made:  None  Labs/ tests ordered today include: None  Orders Placed This Encounter  Procedures  . EKG 12-Lead     Disposition:   FU with me in 12 months.     Signed, Rollene RotundaJames Makailyn Mccormick, MD  04/01/2016 6:37 PM    [facility]

## 2016-04-01 ENCOUNTER — Encounter: Payer: Self-pay | Admitting: Cardiology

## 2016-04-01 ENCOUNTER — Ambulatory Visit (INDEPENDENT_AMBULATORY_CARE_PROVIDER_SITE_OTHER): Payer: Medicare Other | Admitting: Cardiology

## 2016-04-01 VITALS — BP 140/88 | HR 87 | Ht 64.0 in | Wt 135.0 lb

## 2016-04-01 DIAGNOSIS — I447 Left bundle-branch block, unspecified: Secondary | ICD-10-CM | POA: Diagnosis not present

## 2016-04-01 DIAGNOSIS — I481 Persistent atrial fibrillation: Secondary | ICD-10-CM

## 2016-04-01 DIAGNOSIS — I4819 Other persistent atrial fibrillation: Secondary | ICD-10-CM

## 2016-04-01 NOTE — Patient Instructions (Signed)

## 2016-04-06 ENCOUNTER — Telehealth: Payer: Self-pay | Admitting: Internal Medicine

## 2016-04-06 NOTE — Telephone Encounter (Signed)
Spoke with the [facility] Pharmacist and was advised the pt has misplaced her Breo rx that was printed at last OV with MW on 3.20.18. The pharmacist states she will speak with pt again about missing paper rx to see if she is able to locate it. A verbal Breo rx was given to pharmacist regardless in case pt cannot locate printed rx. Nothing further needed at this time.

## 2016-04-09 ENCOUNTER — Telehealth: Payer: Self-pay | Admitting: Internal Medicine

## 2016-04-09 MED ORDER — FUROSEMIDE 20 MG PO TABS: 20.0000 mg | ORAL_TABLET | Freq: Every day | ORAL | 0 refills | Status: DC

## 2016-04-09 NOTE — Telephone Encounter (Signed)
Spoke with pt's daughter Stamps (pt gave verbal ok to speak to pt), states that pt has had worsening wheezing and "crackling" sound in lungs qam since starting Breo prescribed on 03/24/16.   Pt denies any worsening breathing complaints; chest pain/tightness, mucus production, cough, fever, sinus congestion, pnd- only notes wheezing and "crackling" in lungs.  This improves as the day goes on.   Daughter is requesting further recs for pt. Pt uses [facility].  Sending to [facility] as [facility] is unavailable this week- CY please advise on recs.  Thanks!

## 2016-04-09 NOTE — Telephone Encounter (Signed)
Spoke with the pt's daughter and notified of recs per CDY  She verbalized understanding  Rx sent and will call if not improving

## 2016-04-09 NOTE — Telephone Encounter (Signed)
CXR at last ov suggests she may have some congestive heart failure, which can cause wheezing and crackles not helped by [facility].  Offer lasix 20 mg, # 3, 1 daily x 3 days.

## 2016-04-13 ENCOUNTER — Ambulatory Visit (INDEPENDENT_AMBULATORY_CARE_PROVIDER_SITE_OTHER): Payer: Medicare Other | Admitting: Podiatry

## 2016-04-13 DIAGNOSIS — B351 Tinea unguium: Secondary | ICD-10-CM

## 2016-04-13 DIAGNOSIS — M79675 Pain in left toe(s): Secondary | ICD-10-CM

## 2016-04-13 DIAGNOSIS — M79674 Pain in right toe(s): Secondary | ICD-10-CM

## 2016-04-13 NOTE — Progress Notes (Signed)
Subjective:     Patient ID: Loretta Todd, female   DOB: 11/25/1921, 83 y.o.   MRN: [id number]  HPI patient presents with nail disease which become painful 1-5 both feet with thick incurvation noted   Review of Systems     Objective:   Physical Exam Neurovascular status intact with yellow brittle discoloration and pain 1-5 both feet    Assessment:     Mycotic nail infection 1-5 both feet    Plan:     Debris painful nailbeds 1-5 both feet with no iatrogenic bleeding noted

## 2016-04-30 ENCOUNTER — Other Ambulatory Visit (INDEPENDENT_AMBULATORY_CARE_PROVIDER_SITE_OTHER): Payer: Medicare Other

## 2016-04-30 ENCOUNTER — Ambulatory Visit (INDEPENDENT_AMBULATORY_CARE_PROVIDER_SITE_OTHER)
Admission: RE | Admit: 2016-04-30 | Discharge: 2016-04-30 | Disposition: A | Discharge status: Home / Self Care | Payer: Medicare Other | Source: Ambulatory Visit | Attending: Internal Medicine | Admitting: Internal Medicine

## 2016-04-30 ENCOUNTER — Encounter: Payer: Self-pay | Admitting: Adult Health

## 2016-04-30 ENCOUNTER — Ambulatory Visit (INDEPENDENT_AMBULATORY_CARE_PROVIDER_SITE_OTHER): Payer: Medicare Other | Admitting: Internal Medicine

## 2016-04-30 ENCOUNTER — Encounter: Payer: Self-pay | Admitting: Internal Medicine

## 2016-04-30 VITALS — BP 112/70 | HR 73 | Ht 64.0 in | Wt 136.4 lb

## 2016-04-30 DIAGNOSIS — J449 Chronic obstructive pulmonary disease, unspecified: Secondary | ICD-10-CM | POA: Diagnosis not present

## 2016-04-30 DIAGNOSIS — R0609 Other forms of dyspnea: Secondary | ICD-10-CM

## 2016-04-30 LAB — CBC WITH DIFFERENTIAL/PLATELET
BASOS ABS: 0.1 10*3/uL (ref 0.0–0.1)
Basophils Relative: 1 % (ref 0.0–3.0)
EOS ABS: 0.1 10*3/uL (ref 0.0–0.7)
Eosinophils Relative: 1.6 % (ref 0.0–5.0)
HCT: 43.9 % (ref 36.0–46.0)
Hemoglobin: 14.5 g/dL (ref 12.0–15.0)
LYMPHS ABS: 1.4 10*3/uL (ref 0.7–4.0)
Lymphocytes Relative: 17.1 % (ref 12.0–46.0)
MCHC: 33.1 g/dL (ref 30.0–36.0)
MCV: 95.6 fl (ref 78.0–100.0)
MONOS PCT: 9.7 % (ref 3.0–12.0)
Monocytes Absolute: 0.8 10*3/uL (ref 0.1–1.0)
NEUTROS ABS: 6 10*3/uL (ref 1.4–7.7)
NEUTROS PCT: 70.6 % (ref 43.0–77.0)
PLATELETS: 182 10*3/uL (ref 150.0–400.0)
RBC: 4.59 Mil/uL (ref 3.87–5.11)
RDW: 14.2 % (ref 11.5–15.5)
WBC: 8.5 10*3/uL (ref 4.0–10.5)

## 2016-04-30 LAB — BASIC METABOLIC PANEL
BUN: 15 mg/dL (ref 6–23)
CO2: 27 mEq/L (ref 19–32)
CREATININE: 0.74 mg/dL (ref 0.40–1.20)
Calcium: 9.6 mg/dL (ref 8.4–10.5)
Chloride: 105 mEq/L (ref 96–112)
GFR: 77.95 mL/min (ref >60.00)
GLUCOSE: 87 mg/dL (ref 70–99)
POTASSIUM: 4.3 meq/L (ref 3.5–5.1)
Sodium: 138 mEq/L (ref 135–145)

## 2016-04-30 LAB — BRAIN NATRIURETIC PEPTIDE: PRO B NATRI PEPTIDE: 303 pg/mL — AB (ref 0.0–100.0)

## 2016-04-30 LAB — TSH: TSH: 1.28 u[IU]/mL (ref 0.35–4.50)

## 2016-04-30 LAB — SEDIMENTATION RATE: SED RATE: 16 mm/h (ref 0–30)

## 2016-04-30 MED ORDER — FLUTICASONE FUROATE-VILANTEROL 100-25 MCG/INH IN AEPB: 1.0000 | INHALATION_SPRAY | Freq: Every morning | RESPIRATORY_TRACT | 0 refills | Status: DC

## 2016-04-30 MED ORDER — VALSARTAN-HYDROCHLOROTHIAZIDE 80-12.5 MG PO TABS: 1.0000 | ORAL_TABLET | Freq: Every day | ORAL | 11 refills | Status: DC

## 2016-04-30 NOTE — Progress Notes (Signed)
Subjective:     Patient ID: Loretta Todd, female   DOB: December 28, 1921,    MRN: [id number]     Brief patient profile:  26 yowf  Quit smoking 1998 s resp symptoms and played tennis until age 83 while on advair 250 bid  then acutely in Janurary 2018     Admit date: 01/28/2016 Discharge date: 02/01/2016  Admitted From: Home Disposition: Home  Recommendations for Outpatient Follow-up:  1. Follow up with PCP in 1-2 weeks 2. Patient will complete 7 day course of antibiotics on 1/31  and Tamiflu on 1/29. 3. Patient is being discharged on 2 L via nasal cannula continuously.  Home Health: RN and PT Equipment/Devices: Oxygen (2 L via nasal cannula)     Discharge Diagnoses:  Principal Problem:   Severe sepsis (HCC)   Active Problems:   LBBB (left bundle branch block)   Atrial fibrillation (HCC)   Acute respiratory failure with hypoxia (HCC)   Elbow laceration, right, initial encounter   Lobar pneumonia (HCC)  Brief narrative/history of present illness 83 year old female with history of asthma, osteoarthritis, transient third-degree AV block (transient following finger surgery in 2015 with no recurrence) chronic left bundle-branch block (negative for ischemia on stress test in 2004) who presented to the ED with chills with weakness, increasing shortness of breath worse with exertion and cough and lightheadedness for past few days. In the ED she was septic with hypotension, tachycardia and tachypnea with leukocytosis (WBC 17 K) chest x-ray showed multifocal pneumonia. Lactic acid was elevated. Admitted to stepdown unit for further management. Patient received IV fluid bolus for sepsis protocol in the ED following which she went into acute pulmonary edema. Improved after stopping fluids and given IV Lasix.  Hospital course  Principal Problem: Severe sepsis (HCC) Secondary to multilobar pneumonia and influenza A. Sepsis is resolved. Blood culture on admission growing  Streptococcus species. Added antibiotics. Will discharged on oral Levaquin to complete a seven-day course.  ContinueTamiflu (completes 5 day course after 1/28). Supportive care with Tylenol and antitussives.   Active Problems: New onset atrial fibrillation with RVR Secondary to sepsis.. TSH normal.  2-D echo shows EF of 60-65% with normal wall motion, mild AR and moderate MR. Mildly increased PA pressure (38 mmHg) Stable on monitor past 48 hours occasional heart rate in 120s. Monitor during outpatient follow-up.   Acute respiratory failure with hypoxia (HCC) Secondary to lobar pneumoniaand influenza. Plan as above. Evaluated for home O2 and patient requiring 2 L via nasal cannula both at rest and ambulation.  LBBB (left bundle branch block) Chronic.  Prolonged QTC Avoid QT prolonging agents.  Generalized weakness. Seen by PT and recommend home health    03/24/2016 1st [facility] office visit/ Wert   Chief Complaint  Patient presents with  . Pulmonary Consult    Referred by Rueben Bash, PA. Pt states had PNA and Flu in Jan 2018 and has had SOB since then. She states she had severe asthma as a child, and then outgrew this as a teen.   doe = MMRC2 = can't walk a nl pace on a flat grade s sob but does fine slow and flat eg HT but not [employer] fatigue and sob both limit about the same time  Also am cough/ wheeze yellow to brown sputum x sev weeks prior to OV   rec Stop advair and start Breo one each am - take two good drags but open/click just once Please remember to go to the  x-ray department  downstairs in the basement  for your tests - we will call you with the results when they are available     04/30/2016  f/u ov/Wert re:  Chief Complaint  Patient presents with  . Follow-up    Pt stats her breathing is not improving. She is c/o wheezing and has a non prod cough.   lasix really helped / still has to sit propped up 30 degrees to sleep but this  has been her baseline x years  Since finished lasix breathing  getting worse again / mostly noted when wake at nl hour has noisy breathing lasting just a few min p Breo at hs   No obvious day to day or daytime variability or assoc excess/ purulent sputum or mucus plugs or hemoptysis or cp or chest tightness, subjective wheeze or overt sinus or hb symptoms. No unusual exp hx or h/o childhood pna/ asthma or knowledge of premature birth.  Sleeping ok without nocturnal  or early am exacerbation  of respiratory  c/o's or need for noct saba. Also denies any obvious fluctuation of symptoms with weather or environmental changes or other aggravating or alleviating factors except as outlined above   Current Medications, Allergies, Complete Past Medical History, Past Surgical History, Family History, and Social History were reviewed in [employer] record.  ROS  The following are not active complaints unless bolded sore throat, dysphagia, dental problems, itching, sneezing,  nasal congestion or excess/ purulent secretions, ear ache,   fever, chills, sweats, unintended wt loss, classically pleuritic or exertional cp,  orthopnea pnd or leg swelling, presyncope, palpitations, abdominal pain, anorexia, nausea, vomiting, diarrhea  or change in bowel or bladder habits, change in stools or urine, dysuria,hematuria,  rash, arthralgias, visual complaints, headache, numbness, weakness or ataxia or problems with walking or coordination,  change in mood/affect or memory.                          Objective:   Physical Exam    amb pleasantly thin wf nad   Wt Readings from Last 3 Encounters:  04/30/16 136 lb 6.4 oz (61.9 kg)  04/01/16 135 lb (61.2 kg)  03/24/16 123 lb (55.8 kg)    Vital signs reviewed  - Note on arrival 02 sats  92% on RA      HEENT: nl dentition, turbinates bilaterally, and oropharynx. Nl external ear canals without cough reflex   NECK :  without JVD/Nodes/TM/ nl  carotid upstrokes bilaterally   LUNGS: no acc muscle use,  Nl contour chest which is clear to A and P bilaterally without cough on insp or exp maneuvers   CV:  RRR  no s3 or murmur or increase in P2, and no edema   ABD:  soft and nontender with nl inspiratory excursion in the supine position. No bruits or organomegaly appreciated, bowel sounds nl  MS:  Nl gait/ ext warm without deformities, calf tenderness, cyanosis or clubbing No obvious joint restrictions   SKIN: warm and dry without lesions    NEURO:  alert, approp, nl sensorium with  no motor or cerebellar deficits apparent.      CXR PA and Lateral:   04/30/2016 :    I personally reviewed images and agree with radiology impression as follows:    1. Continued clearing of right upper lobe infiltrate.  2. Persistent cardiomegaly with mild interstitial prominence and tiny pleural effusions. Findings again suggesting mild CHF. No change prior exam. A  component of underlying chronic interstitial lung disease cannot be excluded.     Labs ordered/ reviewed:    Chemistry      Component Value Date/Time   NA 138 04/30/2016 1145   K 4.3 04/30/2016 1145   CL 105 04/30/2016 1145   CO2 27 04/30/2016 1145   BUN 15 04/30/2016 1145   CREATININE 0.74 04/30/2016 1145   CREATININE 0.82 03/09/2016 1011      Component Value Date/Time   CALCIUM 9.6 04/30/2016 1145   ALKPHOS 54 01/28/2016 1639   AST 73 (H) 01/28/2016 1639   ALT 64 (H) 01/28/2016 1639   BILITOT 1.0 01/28/2016 1639        Lab Results  Component Value Date   WBC 8.5 04/30/2016   HGB 14.5 04/30/2016   HCT 43.9 04/30/2016   MCV 95.6 04/30/2016   PLT 182.0 04/30/2016        Lab Results  Component Value Date   TSH 1.28 04/30/2016     Lab Results  Component Value Date   PROBNP 303.0 (H) 04/30/2016       Lab Results  Component Value Date   ESRSEDRATE 16 04/30/2016           Assessment:

## 2016-04-30 NOTE — Patient Instructions (Addendum)
diovan hct  80-12.5 one daily   Please remember to go to the lab and x-ray department downstairs in the basement  for your tests - we will call you with the results when they are available.     See Tammy NP w/in 2 weeks with all your medications, even over the counter meds, separated in two separate bags, the ones you take no matter what vs the ones you stop once you feel better and take only as needed when you feel you need them.    -doesn't need med calendar just med rec as sees primary at [facility]

## 2016-05-01 ENCOUNTER — Telehealth: Payer: Self-pay | Admitting: Internal Medicine

## 2016-05-01 LAB — RESPIRATORY ALLERGY PROFILE REGION II ~~LOC~~
Allergen, A. alternata, m6: <0.1 kU/L
Allergen, C. Herbarum, M2: <0.1 kU/L
Allergen, Comm Silver Birch, t9: <0.1 kU/L
Allergen, D pternoyssinus,d7: <0.1 kU/L
Allergen, Mulberry, t76: <0.1 kU/L
Allergen, Oak,t7: <0.1 kU/L
Allergen, P. notatum, m1: <0.1 kU/L
Aspergillus fumigatus, m3: <0.1 kU/L
Bermuda Grass: <0.1 kU/L
Cockroach: <0.1 kU/L
D. farinae: <0.1 kU/L
IgE (Immunoglobulin E), Serum: 128 kU/L — ABNORMAL HIGH (ref <115)
Johnson Grass: <0.1 kU/L
Pecan/Hickory Tree IgE: <0.1 kU/L
Sheep Sorrel IgE: <0.1 kU/L
Timothy Grass: <0.1 kU/L

## 2016-05-01 NOTE — Telephone Encounter (Signed)
  Notes recorded by Nyoka Cowden, MD on 04/30/2016 at 1:44 PM EDT Call patient : Studies are unremarkable, no change in recs ------- Spoke with pt, aware of results/recs.  Nothing further needed.

## 2016-05-03 NOTE — Assessment & Plan Note (Signed)
Echo  01/30/16  Left ventricle: The cavity size was normal. There was mild   concentric hypertrophy. Systolic function was normal. The   estimated ejection fraction was in the range of 60% to 65%. Wall   motion was normal; there were no regional wall motion   abnormalities. - Aortic valve: There was mild regurgitation. Valve area (VTI):   1.11 cm^2. Valve area (Vmax): 1.08 cm^2. Valve area (Vmean): 1.05   cm^2. - Mitral valve: Mildly calcified annulus. There was moderate   regurgitation. - Left atrium: The atrium was mildly dilated. - Tricuspid valve: There was mild-moderate regurgitation. - Pulmonary arteries: Systolic pressure was mildly increased. PA   peak pressure: 38 mm Hg (S). Added diovan/hct 04/30/2016    She clearly would benefit from afterload reduction in setting of both AR and MR but will need close f/u for bp and bun/ creat as well as K so asked her to return with all meds in hand using a trust but verify approach to confirm accurate Medication  Reconciliation The principal here is that until we are certain that the  patients are doing what we've asked, it makes no sense to ask them to do more.    I had an extended discussion with the patient reviewing all relevant studies completed to date and  lasting 15 to 20 minutes of a 25 minute visit    Each maintenance medication was reviewed in detail including most importantly the difference between maintenance and prns and under what circumstances the prns are to be triggered using an action plan format that is not reflected in the computer generated alphabetically organized AVS.    Please see AVS for specific instructions unique to this visit that I personally wrote and verbalized to the the pt in detail and then reviewed with pt  by my nurse highlighting any  changes in therapy recommended at today's visit to their plan of care.

## 2016-05-03 NOTE — Assessment & Plan Note (Signed)
Quit smoking 1998 - Spirometry 03/24/2016  FEV1 1.27 (85%)  Ratio 64  - 03/24/2016  After extensive coaching HFA effectiveness =    0 % but 90% with dpi so rec change to BREO one click each am   - Allergy profile 04/30/16  >  Eos 0.1/  IgE  128 neg RAST    04/30/2016  After extensive coaching device  effectiveness =    75% with DPI > continue Breo   Despite suboptimal timing (should be q am) and technique for breo I really think the copd component is well addressed with most of her symptoms dependent on lung water > (see separate a/p)   No change resp rx for now

## 2016-05-18 ENCOUNTER — Ambulatory Visit (INDEPENDENT_AMBULATORY_CARE_PROVIDER_SITE_OTHER): Payer: Medicare Other | Admitting: Podiatry

## 2016-05-18 ENCOUNTER — Encounter: Payer: Medicare Other | Admitting: Adult Health

## 2016-05-18 DIAGNOSIS — M79676 Pain in unspecified toe(s): Secondary | ICD-10-CM

## 2016-05-18 DIAGNOSIS — B351 Tinea unguium: Secondary | ICD-10-CM | POA: Diagnosis not present

## 2016-05-18 DIAGNOSIS — M79674 Pain in right toe(s): Secondary | ICD-10-CM

## 2016-05-18 DIAGNOSIS — M79675 Pain in left toe(s): Secondary | ICD-10-CM

## 2016-05-19 ENCOUNTER — Encounter: Payer: Medicare Other | Admitting: Adult Health

## 2016-05-19 NOTE — Progress Notes (Signed)
Subjective:    Patient ID: Loretta SpikeEleanor S Rens, female   DOB: 83 y.o.   MRN: [id number]   HPI patient's found have thick incurvated nailbeds 1-5 both feet    ROS      Objective:  Physical Exam Mycotic nail infection bilateral    Assessment:     Thick yellow brittle nails 1-5 both feet    Plan:     Debris painful nailbeds 1-5 both feet with no iatrogenic bleeding noted

## 2016-05-20 ENCOUNTER — Encounter: Payer: Self-pay | Admitting: Adult Health

## 2016-05-20 ENCOUNTER — Ambulatory Visit (INDEPENDENT_AMBULATORY_CARE_PROVIDER_SITE_OTHER): Payer: Medicare Other | Admitting: Adult Health

## 2016-05-20 VITALS — BP 102/56 | HR 72 | Ht 64.0 in | Wt 133.8 lb

## 2016-05-20 DIAGNOSIS — J9611 Chronic respiratory failure with hypoxia: Secondary | ICD-10-CM | POA: Diagnosis not present

## 2016-05-20 DIAGNOSIS — J449 Chronic obstructive pulmonary disease, unspecified: Secondary | ICD-10-CM

## 2016-05-20 DIAGNOSIS — R0609 Other forms of dyspnea: Secondary | ICD-10-CM

## 2016-05-20 NOTE — Addendum Note (Signed)
Addended by: Boone MasterJONES, JESSICA E on: 05/20/2016 12:20 PM   Modules accepted: Orders

## 2016-05-20 NOTE — Progress Notes (Signed)
@Patient  ID: Loretta Todd, female    DOB: 1921-08-08, 83 y.o.   MRN: [id number]  Chief Complaint  Patient presents with  . Follow-up    COPD     Referring provider: Delorse Lek, MD  HPI: 83 year old female former smoker, quit 1998, followed for COPD Gold 1 and O2 RF   TEST  Echo 01/2016 EF 60-65%, PAP  Spirometry 03/24/2016  FEV1 1.27 (85%)  Ratio 64   - Allergy profile 04/30/16  >  Eos 0.1/  IgE  128 neg RAST     05/20/2016 Follow up : COPD /O2 RF  Pt returns for 1 month follow up . She says she is doing well on BREO .  Was started on oxygen in January but does not feel she needs it . Has not wore it for last couple months. Walk test in office today with O2 sats 96-98% on room air. Wants oxygen to be discontinued . Marland Kitchen She denies flare of cough or dyspnea.  She has  A Fib s/p cardioversion . Has hx of AV block and chronic L BBB .  She remains on NSR.  She was started on Diovan HCT last ov with elevated BNP ~303. Says she is tolerating . No dizziness.  We reviewed all her medications organize them into a medication count with patient education. Appears to be taking her medications correctly  No Known Allergies   There is no immunization history on file for this patient.  Past Medical History:  Diagnosis Date  . Arthritis   . Asthma   . Lumbar radiculopathy   . Mitral late systolic murmur   . OA (osteoarthritis)    DIPs and PIPs  . Rotator cuff tendinitis     Tobacco History: History  Smoking Status  . Former Smoker  . Packs/day: 0.50  . Years: 54.00  . Types: Cigarettes  . Quit date: 06/14/1996  Smokeless Tobacco  . Never Used   Counseling given: Not Answered   Outpatient Encounter Prescriptions as of 05/20/2016  Medication Sig  . apixaban (ELIQUIS) 2.5 MG TABS tablet Take 1 tablet (2.5 mg total) by mouth 2 (two) times daily.  . fluticasone furoate-vilanterol (BREO ELLIPTA) 100-25 MCG/INH AEPB Inhale 1 puff into the lungs every morning.  .  metoprolol succinate (TOPROL XL) 25 MG 24 hr tablet Take 1 tablet (25 mg total) by mouth daily.  . polymixin-bacitracin (POLYSPORIN) 500-10000 UNIT/GM OINT ointment Apply 1 application topically 2 (two) times daily.  . valsartan-hydrochlorothiazide (DIOVAN HCT) 80-12.5 MG tablet Take 1 tablet by mouth daily.   No facility-administered encounter medications on file as of 05/20/2016.      Review of Systems  Constitutional:   No  weight loss, night sweats,  Fevers, chills, fatigue, or  lassitude.  HEENT:   No headaches,  Difficulty swallowing,  Tooth/dental problems, or  Sore throat,                No sneezing, itching, ear ache, nasal congestion, post nasal drip,   CV:  No chest pain,  Orthopnea, PND, swelling in lower extremities, anasarca, dizziness, palpitations, syncope.   GI  No heartburn, indigestion, abdominal pain, nausea, vomiting, diarrhea, change in bowel habits, loss of appetite, bloody stools.   Resp: No shortness of breath with exertion or at rest.  No excess mucus, no productive cough,  No non-productive cough,  No coughing up of blood.  No change in color of mucus.  No wheezing.  No chest wall deformity  Skin: no rash or lesions.  GU: no dysuria, change in color of urine, no urgency or frequency.  No flank pain, no hematuria   MS:  No joint pain or swelling.  No decreased range of motion.  No back pain.    Physical Exam  BP (!) 102/56 (BP Location: Left Arm, Cuff Size: Normal)   Pulse 72   Ht 5\' 4"  (1.626 m)   Wt 133 lb 12.8 oz (60.7 kg)   SpO2 96%   BMI 22.97 kg/m   GEN: A/Ox3; pleasant , NAD, elderly    HEENT:  [place]/AT,  EACs-clear, TMs-wnl, NOSE-clear, THROAT-clear, no lesions, no postnasal drip or exudate noted.   NECK:  Supple w/ fair ROM; no JVD; normal carotid impulses w/o bruits; no thyromegaly or nodules palpated; no lymphadenopathy.    RESP  Clear  P & A; w/o, wheezes/ rales/ or rhonchi. no accessory muscle use, no dullness to percussion  CARD:   RRR, no m/r/g, no peripheral edema, pulses intact, no cyanosis or clubbing.  GI:   Soft & nt; nml bowel sounds; no organomegaly or masses detected.   Musco: Warm bil, no deformities or joint swelling noted.   Neuro: alert, no focal deficits noted.    Skin: Warm, no lesions or rashes    Lab Results:  CBC    Component Value Date/Time   WBC 8.5 04/30/2016 1145   RBC 4.59 04/30/2016 1145   HGB 14.5 04/30/2016 1145   HCT 43.9 04/30/2016 1145   PLT 182.0 04/30/2016 1145   MCV 95.6 04/30/2016 1145   MCH 31.7 03/09/2016 1011   MCHC 33.1 04/30/2016 1145   RDW 14.2 04/30/2016 1145   LYMPHSABS 1.4 04/30/2016 1145   MONOABS 0.8 04/30/2016 1145   EOSABS 0.1 04/30/2016 1145   BASOSABS 0.1 04/30/2016 1145    BMET    Component Value Date/Time   NA 138 04/30/2016 1145   K 4.3 04/30/2016 1145   CL 105 04/30/2016 1145   CO2 27 04/30/2016 1145   GLUCOSE 87 04/30/2016 1145   BUN 15 04/30/2016 1145   CREATININE 0.74 04/30/2016 1145   CREATININE 0.82 03/09/2016 1011   CALCIUM 9.6 04/30/2016 1145   GFRNONAA 54 (L) 01/30/2016 0211   GFRAA >60 01/30/2016 0211    BNP No results found for: BNP  ProBNP    Component Value Date/Time   PROBNP 303.0 (H) 04/30/2016 1145    Imaging: Dg Chest 2 View  Result Date: 04/30/2016 CLINICAL DATA:  Routine evaluation.  History of COPD . EXAM: CHEST  2 VIEW COMPARISON:  Chest x-ray 03/24/2016, 02/26/2016 01/28/2016. CT 02/27/2016. FINDINGS: Stable cardiomegaly. Diffuse bilateral interstitial prominence is unchanged. Small bilateral pleural effusions unchanged. Mild persistent CHF cannot be excluded. Component of underlying chronic interstitial lung disease most likely present. Continued partial clearing of right upper lobe infiltrate. Degenerative changes scoliosis thoracic spine . IMPRESSION: 1. Continued clearing of right upper lobe infiltrate. 2. Persistent cardiomegaly with mild interstitial prominence and tiny pleural effusions. Findings again  suggesting mild CHF. No change prior exam. A component of underlying chronic interstitial lung disease cannot be excluded. Electronically Signed   By: Maisie Fus  Register   On: 04/30/2016 12:52     Assessment & Plan:   COPD GOLD I  Improved control on BREO  No changes  Patient's medications were reviewed today and patient education was given. Computerized medication calendar was adjusted/completed    Dyspnea on exertion Patient had some mild pulmonary hypertension on echo imaging were 2018. She has  no evidence of overt volume overload. She is tolerating Diovan HCTZ well without hypotension or dizziness  Chronic respiratory failure with hypoxia (HCC) No desaturations with ambulation. May discontinue oxygen     Rubye Oaksammy Genecis Veley, NP 05/20/2016

## 2016-05-20 NOTE — Patient Instructions (Addendum)
May discontinue Oxygen .  Continue on BREO 1 puff daily , rinse after use.  Follow up with Dr. Sherene SiresWert  In 3-4 months and As needed   Please contact office for sooner follow up if symptoms do not improve or worsen or seek emergency care

## 2016-05-20 NOTE — Addendum Note (Signed)
Addended by: Boone MasterJONES, JESSICA E on: 05/20/2016 12:45 PM   Modules accepted: Orders

## 2016-05-20 NOTE — Assessment & Plan Note (Signed)
No desaturations with ambulation. May discontinue oxygen

## 2016-05-20 NOTE — Assessment & Plan Note (Signed)
Patient had some mild pulmonary hypertension on echo imaging were 2018. She has no evidence of overt volume overload. She is tolerating Diovan HCTZ well without hypotension or dizziness

## 2016-05-20 NOTE — Assessment & Plan Note (Signed)
Improved control on BREO  No changes  Patient's medications were reviewed today and patient education was given. Computerized medication calendar was adjusted/completed

## 2016-05-21 NOTE — Progress Notes (Signed)
Chart and office note reviewed in detail  > agree with a/p as outlined    

## 2016-05-25 ENCOUNTER — Institutional Professional Consult (permissible substitution): Payer: Medicare Other | Admitting: Pulmonary Disease

## 2016-06-12 NOTE — Addendum Note (Signed)
Addendum  created 06/12/16 1103 by Alston Berrie, MD   Sign clinical note    

## 2016-06-15 ENCOUNTER — Ambulatory Visit (INDEPENDENT_AMBULATORY_CARE_PROVIDER_SITE_OTHER): Payer: Self-pay | Admitting: Podiatry

## 2016-06-15 DIAGNOSIS — B351 Tinea unguium: Secondary | ICD-10-CM

## 2016-06-15 DIAGNOSIS — M79674 Pain in right toe(s): Secondary | ICD-10-CM

## 2016-06-15 DIAGNOSIS — M79675 Pain in left toe(s): Secondary | ICD-10-CM | POA: Diagnosis not present

## 2016-06-16 NOTE — Progress Notes (Signed)
Subjective: 83 y.o. returns the office today for painful, elongated, thickened toenails which she cannot trim herself. Denies any redness or drainage around the nails. Denies any acute changes since last appointment and no new complaints today. Denies any systemic complaints such as fevers, chills, nausea, vomiting.   Objective: NAD DP/PT pulses palpable, CRT less than 3 seconds Nails hypertrophic, dystrophic, elongated, brittle, discolored 10. There is tenderness overlying the nails 1-5 bilaterally. There is no surrounding erythema or drainage along the nail sites. No open lesions or pre-ulcerative lesions are identified. No other areas of tenderness bilateral lower extremities. No overlying edema, erythema, increased warmth. No pain with calf compression, swelling, warmth, erythema.  Assessment: Patient presents with symptomatic onychomycosis  Plan: -Treatment options including alternatives, risks, complications were discussed -Nails sharply debrided 10 without complication/bleeding. -Discussed daily foot inspection. If there are any changes, to call the office immediately.  -Follow-up in 3 months or sooner if any problems are to arise. In the meantime, encouraged to call the office with any questions, concerns, changes symptoms.  Ovid CurdMatthew Wagoner, DPM  JQ

## 2016-07-20 ENCOUNTER — Other Ambulatory Visit: Payer: Medicare Other

## 2016-08-19 ENCOUNTER — Ambulatory Visit (INDEPENDENT_AMBULATORY_CARE_PROVIDER_SITE_OTHER): Payer: Medicare Other | Admitting: Podiatry

## 2016-08-19 DIAGNOSIS — M79676 Pain in unspecified toe(s): Secondary | ICD-10-CM

## 2016-08-19 DIAGNOSIS — B351 Tinea unguium: Secondary | ICD-10-CM | POA: Diagnosis not present

## 2016-08-19 NOTE — Progress Notes (Signed)
   SUBJECTIVE Patient  presents to office today complaining of elongated, thickened nails. Pain while ambulating in shoes. Patient is unable to trim their own nails.   OBJECTIVE General Patient is awake, alert, and oriented x 3 and in no acute distress. Derm Skin is dry and supple bilateral. Negative open lesions or macerations. Remaining integument unremarkable. Nails are tender, long, thickened and dystrophic with subungual debris, consistent with onychomycosis, 1-5 bilateral. No signs of infection noted. Vasc  DP and PT pedal pulses palpable bilaterally. Temperature gradient within normal limits.  Neuro Epicritic and protective threshold sensation diminished bilaterally.  Musculoskeletal Exam No symptomatic pedal deformities noted bilateral. Muscular strength within normal limits.  ASSESSMENT 1. Onychodystrophic nails 1-5 bilateral with hyperkeratosis of nails.  2. Onychomycosis of nail due to dermatophyte bilateral 3. Pain in foot bilateral  PLAN OF CARE 1. Patient evaluated today.  2. Instructed to maintain good pedal hygiene and foot care.  3. Mechanical debridement of nails 1-5 bilaterally performed using a nail nipper. Filed with dremel without incident.  4. Return to clinic in 3 mos.    Arsenio Schnorr M. Slayter Moorhouse, DPM [facility]  Dr. Elspeth Blucher M. Yamili Lichtenwalner, DPM    [address]                                        [place], [place] [address]                Office [phone]  Fax [phone]      

## 2016-08-23 ENCOUNTER — Emergency Department (HOSPITAL_COMMUNITY)
Admission: EM | Admit: 2016-08-23 | Discharge: 2016-08-23 | Disposition: A | Discharge status: Home / Self Care | Payer: Medicare Other | Attending: Emergency Medicine | Admitting: Emergency Medicine

## 2016-08-23 ENCOUNTER — Emergency Department (HOSPITAL_COMMUNITY): Payer: Medicare Other

## 2016-08-23 ENCOUNTER — Encounter (HOSPITAL_COMMUNITY): Payer: Self-pay | Admitting: *Deleted

## 2016-08-23 DIAGNOSIS — Z87891 Personal history of nicotine dependence: Secondary | ICD-10-CM | POA: Diagnosis not present

## 2016-08-23 DIAGNOSIS — S81812A Laceration without foreign body, left lower leg, initial encounter: Secondary | ICD-10-CM | POA: Diagnosis not present

## 2016-08-23 DIAGNOSIS — Z79899 Other long term (current) drug therapy: Secondary | ICD-10-CM | POA: Diagnosis not present

## 2016-08-23 DIAGNOSIS — S81811A Laceration without foreign body, right lower leg, initial encounter: Secondary | ICD-10-CM | POA: Insufficient documentation

## 2016-08-23 DIAGNOSIS — J45909 Unspecified asthma, uncomplicated: Secondary | ICD-10-CM | POA: Insufficient documentation

## 2016-08-23 DIAGNOSIS — Y9301 Activity, walking, marching and hiking: Secondary | ICD-10-CM | POA: Insufficient documentation

## 2016-08-23 DIAGNOSIS — J449 Chronic obstructive pulmonary disease, unspecified: Secondary | ICD-10-CM | POA: Insufficient documentation

## 2016-08-23 DIAGNOSIS — S0990XA Unspecified injury of head, initial encounter: Secondary | ICD-10-CM | POA: Insufficient documentation

## 2016-08-23 DIAGNOSIS — W19XXXA Unspecified fall, initial encounter: Secondary | ICD-10-CM

## 2016-08-23 DIAGNOSIS — Y92007 Garden or yard of unspecified non-institutional (private) residence as the place of occurrence of the external cause: Secondary | ICD-10-CM | POA: Diagnosis not present

## 2016-08-23 DIAGNOSIS — Y998 Other external cause status: Secondary | ICD-10-CM | POA: Diagnosis not present

## 2016-08-23 DIAGNOSIS — Z23 Encounter for immunization: Secondary | ICD-10-CM | POA: Diagnosis not present

## 2016-08-23 DIAGNOSIS — W010XXA Fall on same level from slipping, tripping and stumbling without subsequent striking against object, initial encounter: Secondary | ICD-10-CM | POA: Diagnosis not present

## 2016-08-23 DIAGNOSIS — Z7901 Long term (current) use of anticoagulants: Secondary | ICD-10-CM | POA: Insufficient documentation

## 2016-08-23 DIAGNOSIS — T148XXA Other injury of unspecified body region, initial encounter: Secondary | ICD-10-CM

## 2016-08-23 LAB — CBC
HCT: 41.3 % (ref 36.0–46.0)
Hemoglobin: 14.4 g/dL (ref 12.0–15.0)
MCH: 32.4 pg (ref 26.0–34.0)
MCHC: 34.9 g/dL (ref 30.0–36.0)
MCV: 93 fL (ref 78.0–100.0)
PLATELETS: 200 10*3/uL (ref 150–400)
RBC: 4.44 MIL/uL (ref 3.87–5.11)
RDW: 13.5 % (ref 11.5–15.5)
WBC: 13.6 10*3/uL — ABNORMAL HIGH (ref 4.0–10.5)

## 2016-08-23 LAB — BASIC METABOLIC PANEL
Anion gap: 8 (ref 5–15)
BUN: 20 mg/dL (ref 6–20)
CALCIUM: 9 mg/dL (ref 8.9–10.3)
CO2: 22 mmol/L (ref 22–32)
Chloride: 103 mmol/L (ref 101–111)
Creatinine, Ser: 0.97 mg/dL (ref 0.44–1.00)
GFR calc Af Amer: 57 mL/min — ABNORMAL LOW (ref >60)
GFR, EST NON AFRICAN AMERICAN: 49 mL/min — AB (ref >60)
Glucose, Bld: 117 mg/dL — ABNORMAL HIGH (ref 65–99)
POTASSIUM: 3.8 mmol/L (ref 3.5–5.1)
SODIUM: 133 mmol/L — AB (ref 135–145)

## 2016-08-23 LAB — I-STAT CG4 LACTIC ACID, ED: Lactic Acid, Venous: 1.99 mmol/L — ABNORMAL HIGH (ref 0.5–1.9)

## 2016-08-23 LAB — CK: CK TOTAL: 1476 U/L — AB (ref 38–234)

## 2016-08-23 MED ORDER — TETANUS-DIPHTH-ACELL PERTUSSIS 5-2.5-18.5 LF-MCG/0.5 IM SUSP
0.5000 mL | Freq: Once | INTRAMUSCULAR | Status: AC
Start: 2016-08-23 — End: 2016-08-23
  Administered 2016-08-23: 0.5 mL via INTRAMUSCULAR
  Filled 2016-08-23: qty 0.5

## 2016-08-23 MED ORDER — ACETAMINOPHEN 325 MG PO TABS
650.0000 mg | ORAL_TABLET | Freq: Once | ORAL | Status: AC
  Administered 2016-08-23: 650 mg via ORAL
  Filled 2016-08-23: qty 2

## 2016-08-23 MED ORDER — CEFAZOLIN SODIUM-DEXTROSE 1-4 GM/50ML-% IV SOLN
1.0000 g | Freq: Once | INTRAVENOUS | Status: AC
  Administered 2016-08-23: 1 g via INTRAVENOUS
  Filled 2016-08-23: qty 50

## 2016-08-23 MED ORDER — CEPHALEXIN 250 MG PO CAPS: 250.0000 mg | ORAL_CAPSULE | Freq: Four times a day (QID) | ORAL | 0 refills | Status: DC

## 2016-08-23 MED ORDER — BUPIVACAINE HCL 0.5 % IJ SOLN
50.0000 mL | Freq: Once | INTRAMUSCULAR | Status: AC
  Administered 2016-08-23: 1 mL
  Filled 2016-08-23: qty 50

## 2016-08-23 NOTE — ED Provider Notes (Signed)
[facility]ENCY DEPT Provider Note   CSN: [id number] Arrival date & time: 08/23/16  1326     History   Chief Complaint Chief Complaint  Patient presents with  . Fall    HPI Loretta Todd is a 83 y.o. female.  HPI Patient with history of AF on dialysis who presents after fall. Daughter provides most of history, as patient minimizes most of her symptoms. Patient lives alone. She was walking her dog at approximately 11:00 last night when she tripped over a brick wall backyard, and landed face forward. She denies any loss of consciousness. However she was unable to get up, and remained on the ground until 9:00 the following morning when she was discovered by horseback riders that heard her screaming. Intermittent rain storms overnight. She currently denies any pain. Denies any preceding chest pain or shortness of breath. She is oriented to have multiple skin tears and lacerations to her bilateral lower extremities.  Her daughter at bedside reports that she has started the process of obtaining home health nursing for her mother, however, there has been resistant. She has frequent falls.  Past Medical History:  Diagnosis Date  . Arthritis   . Asthma   . Lumbar radiculopathy   . Mitral late systolic murmur   . OA (osteoarthritis)    DIPs and PIPs  . Rotator cuff tendinitis     Patient Active Problem List   Diagnosis Date Noted  . Chronic respiratory failure with hypoxia (HCC) 05/20/2016  . Dyspnea on exertion 04/30/2016  . COPD GOLD I  03/24/2016  . Severe sepsis (HCC) 01/28/2016  . Atrial fibrillation (HCC) 01/28/2016  . Lobar pneumonia (HCC) 01/28/2016  . Acute respiratory failure with hypoxia (HCC)   . Elbow laceration, right, initial encounter   . Cramp of both lower extremities 07/01/2015  . Primary osteoarthritis involving multiple joints 07/01/2015  . Asthma 03/07/2015  . Ganglion cyst 03/07/2015  . Primary osteoarthritis of right hip 03/07/2015  . Increased  frequency of urination 03/07/2015  . Bradycardia 06/27/2013  . LBBB (left bundle branch block) 06/27/2013  . AV block, 3rd degree (HCC) 06/27/2013    Past Surgical History:  Procedure Laterality Date  . CARDIOVERSION N/A 03/20/2016   Procedure: CARDIOVERSION;  Surgeon: Quintella Reichert, MD;  Location: [facility];  Service: Cardiovascular;  Laterality: N/A;  . EAR CYST EXCISION Left 06/20/2013   Procedure: DEBRIDE MUCOID CYST/REMOVE LOOSE BODIES LEFT RING FINGER ;  Surgeon: Wyn Forster, MD;  Location: [facility];  Service: Orthopedics;  Laterality: Left;  . EYE SURGERY     both cataracts  . KNEE ARTHROSCOPY W/ MENISCAL REPAIR  2010   right  . TONSILLECTOMY      OB History    No data available       Home Medications    Prior to Admission medications   Medication Sig Start Date End Date Taking? Authorizing Provider  apixaban (ELIQUIS) 2.5 MG TABS tablet Take 1 tablet (2.5 mg total) by mouth 2 (two) times daily. 02/21/16  Yes Rollene Rotunda, MD  fluticasone furoate-vilanterol (BREO ELLIPTA) 100-25 MCG/INH AEPB Inhale 1 puff into the lungs every morning. Patient taking differently: Inhale 1 puff into the lungs daily.  04/30/16  Yes Nyoka Cowden, MD  metoprolol succinate (TOPROL XL) 25 MG 24 hr tablet Take 1 tablet (25 mg total) by mouth daily. Patient taking differently: Take 25 mg by mouth at bedtime.  02/21/16  Yes Rollene Rotunda, MD  valsartan-hydrochlorothiazide (DIOVAN HCT)  80-12.5 MG tablet Take 1 tablet by mouth daily. 04/30/16  Yes Nyoka Cowden, MD  acetaminophen (TYLENOL) 325 MG tablet Take 325 mg by mouth every 6 (six) hours as needed for headache (pain).     [provider]  cephALEXin (KEFLEX) 250 MG capsule Take 1 capsule (250 mg total) by mouth 4 (four) times daily. 08/23/16   Wynelle Cleveland, MD    Family History Family History  Problem Relation Age of Onset  . Cancer Father     Social History Social History  Substance Use  Topics  . Smoking status: Former Smoker    Packs/day: 0.50    Years: 54.00    Types: Cigarettes    Quit date: 06/14/1996  . Smokeless tobacco: Never Used  . Alcohol use Yes     Comment: daily scotch     Allergies   Patient has no known allergies.   Review of Systems Review of Systems  Constitutional: Negative for chills and fever.  HENT: Negative for ear pain and sore throat.   Eyes: Negative for pain and visual disturbance.  Respiratory: Negative for cough and shortness of breath.   Cardiovascular: Negative for chest pain and palpitations.  Gastrointestinal: Negative for abdominal pain and vomiting.  Genitourinary: Negative for dysuria and hematuria.  Musculoskeletal: Negative for arthralgias and back pain.  Skin: Positive for wound. Negative for color change and rash.  Neurological: Negative for seizures and syncope.  All other systems reviewed and are negative.    Physical Exam Updated Vital Signs BP 114/71   Pulse 87   Temp 98.2 F (36.8 C) (Oral)   Resp 19   Wt 62.6 kg (138 lb)   SpO2 93%   BMI 23.69 kg/m   Physical Exam  Constitutional: She appears well-developed and well-nourished. No distress.  HENT:  Head: Normocephalic and atraumatic.  Eyes: Conjunctivae are normal.  Neck: Neck supple.  Cardiovascular: Normal rate and regular rhythm.   No murmur heard. Pulmonary/Chest: Effort normal and breath sounds normal. No respiratory distress.  Abdominal: Soft. There is no tenderness.  Musculoskeletal: She exhibits no edema.  Neurological: She is alert.  Skin: Skin is warm and dry.  Multiple skin tears to bilateral lower extremities. Large gaping skin tear mid left tibia. No ttp.   Psychiatric: She has a normal mood and affect.  Nursing note and vitals reviewed.    ED Treatments / Results  Labs (all labs ordered are listed, but only abnormal results are displayed) Labs Reviewed  CBC - Abnormal; Notable for the following:       Result Value   WBC 13.6  (*)    All other components within normal limits  BASIC METABOLIC PANEL - Abnormal; Notable for the following:    Sodium 133 (*)    Glucose, Bld 117 (*)    GFR calc non Af Amer 49 (*)    GFR calc Af Amer 57 (*)    All other components within normal limits  CK - Abnormal; Notable for the following:    Total CK 1,476 (*)    All other components within normal limits  I-STAT CG4 LACTIC ACID, ED - Abnormal; Notable for the following:    Lactic Acid, Venous 1.99 (*)    All other components within normal limits    EKG  EKG Interpretation  Date/Time:  Sunday August 23 2016 18:15:43 EDT Ventricular Rate:  85 PR Interval:    QRS Duration: 137 QT Interval:  429 QTC Calculation: 511 R Axis:   -  77 Text Interpretation:  Atrial fibrillation Nonspecific IVCD with LAD LVH with secondary repolarization abnormality Probable inferior infarct, recent Anterior infarct, old Confirmed by Blane Ohara [id number]) on 08/23/2016 6:18:35 PM       Radiology Dg Chest 2 View  Result Date: 08/23/2016 CLINICAL DATA:  Fall EXAM: CHEST  2 VIEW COMPARISON:  04/30/2016 chest radiograph. FINDINGS: Stable cardiomediastinal silhouette with mild cardiomegaly and aortic atherosclerosis. No pneumothorax. No pleural effusion. Hyperinflated lungs. Emphysema. No overt pulmonary edema. No acute consolidative airspace disease. No displaced fractures in the chest. IMPRESSION: 1. No acute disease in the chest. 2. Hyperinflated lungs and emphysema, suggesting COPD. 3. Mild cardiomegaly without overt pulmonary edema. Electronically Signed   By: Delbert Phenix M.D.   On: 08/23/2016 16:54   Dg Elbow Complete Left  Result Date: 08/23/2016 CLINICAL DATA:  Recent fall with pain and soft tissue injuries, initial encounter EXAM: LEFT ELBOW - COMPLETE 3+ VIEW COMPARISON:  None. FINDINGS: There is no evidence of fracture, dislocation, or joint effusion. There is no evidence of arthropathy or other focal bone abnormality. Soft tissues are  unremarkable. IMPRESSION: No acute abnormality noted. Electronically Signed   By: Alcide Clever M.D.   On: 08/23/2016 16:53   Dg Tibia/fibula Left  Result Date: 08/23/2016 CLINICAL DATA:  Recent fall with soft tissue injury, initial encounter EXAM: LEFT TIBIA AND FIBULA - 2 VIEW COMPARISON:  07/01/2015 FINDINGS: Mild degenerative changes of the hip joints are noted. Soft tissue laceration is noted anterolaterally consistent with the given clinical history. No acute fracture or dislocation is noted. IMPRESSION: Soft tissue injury without acute bony abnormality. Electronically Signed   By: Alcide Clever M.D.   On: 08/23/2016 17:00   Dg Tibia/fibula Right  Result Date: 08/23/2016 CLINICAL DATA:  Recent fall with soft tissue injury, initial encounter EXAM: RIGHT TIBIA AND FIBULA - 2 VIEW COMPARISON:  None. FINDINGS: Mild degenerative changes are noted in the medial knee joint. No acute fracture or dislocation is noted. No joint effusion is noted. Mild irregularity of the soft tissue is noted anteriorly. IMPRESSION: Soft tissue injury without acute bony abnormality. Electronically Signed   By: Alcide Clever M.D.   On: 08/23/2016 17:00   Ct Head Wo Contrast  Result Date: 08/23/2016 CLINICAL DATA:  Fall last night with head injury. Anticoagulated on Eliquis. EXAM: CT HEAD WITHOUT CONTRAST TECHNIQUE: Contiguous axial images were obtained from the base of the skull through the vertex without intravenous contrast. COMPARISON:  01/28/2016 head CT. FINDINGS: Brain: Stable small left thalamic lacune. No evidence of parenchymal hemorrhage or extra-axial fluid collection. No mass lesion, mass effect, or midline shift. No CT evidence of acute infarction. Generalized cerebral volume loss. Nonspecific moderate subcortical and periventricular white matter hypodensity, most in keeping with chronic small vessel ischemic change. No ventriculomegaly. Vascular: No acute abnormality. Skull: No evidence of calvarial fracture.  Sinuses/Orbits: The visualized paranasal sinuses are essentially clear. Other: Small partial left mastoid effusion. Clear right mastoid air cells. IMPRESSION: 1. No evidence of acute intracranial abnormality. No evidence of calvarial fracture. 2. Generalized cerebral volume loss, moderate chronic small vessel ischemia and stable small left thalamic lacune. 3. Nonspecific small partial left mastoid effusion . Electronically Signed   By: Delbert Phenix M.D.   On: 08/23/2016 16:18    Procedures .Marland KitchenLaceration Repair Date/Time: 08/24/2016 12:28 AM Performed by: Wynelle Cleveland Authorized by: Wynelle Cleveland   Consent:    Consent obtained:  Verbal   Consent given by:  Patient   Risks discussed:  Infection,  pain, poor cosmetic result and poor wound healing   Alternatives discussed:  No treatment Anesthesia (see MAR for exact dosages):    Anesthesia method:  Local infiltration   Local anesthetic:  Bupivacaine 0.5% w/o epi Laceration details:    Location:  Leg   Leg location:  L lower leg   Length (cm):  7   Depth (mm):  1 Repair type:    Repair type:  Simple Pre-procedure details:    Preparation:  Patient was prepped and draped in usual sterile fashion and imaging obtained to evaluate for foreign bodies Exploration:    Wound exploration: entire depth of wound probed and visualized     Contaminated: no   Treatment:    Area cleansed with:  Saline   Amount of cleaning:  Extensive   Irrigation solution:  Sterile saline   Irrigation volume:  1000   Irrigation method:  Pressure wash Skin repair:    Repair method:  Sutures   Suture size:  3-0   Suture material:  Prolene   Suture technique:  Simple interrupted   Number of sutures:  2 Approximation:    Approximation:  Loose Post-procedure details:    Dressing:  Antibiotic ointment, non-adherent dressing and tube gauze   Patient tolerance of procedure:  Tolerated well, no immediate complications   (including critical care  time)  Medications Ordered in ED Medications  ceFAZolin (ANCEF) IVPB 1 g/50 mL premix (0 g Intravenous Stopped 08/23/16 1821)  Tdap (BOOSTRIX) injection 0.5 mL (0.5 mLs Intramuscular Given 08/23/16 1752)  bupivacaine (MARCAINE) 0.5 % (with pres) injection 50 mL (1 mL Infiltration Given by Other 08/23/16 1924)  acetaminophen (TYLENOL) tablet 650 mg (650 mg Oral Given 08/23/16 1932)     Initial Impression / Assessment and Plan / ED Course  I have reviewed the triage vital signs and the nursing notes.  Pertinent labs & imaging results that were available during my care of the patient were reviewed by me and considered in my medical decision making (see chart for details).     Patient is a 83 year old female with history of AF on request presents after mechanical fall outdoors last evening. Outdoors for 10 hours lying on ground until discovered by neighbors. Patient is stable, no acute distress. Extremities about, significant for multiple skin tears to bilateral lower extremities.  Labs stable. CK elevated to 1500 without any AKI. CT head and extremity radiographs negative for acute findings. Skin tear approximated with loose sutures, see procedure note for details. She was treated with pain control, tetanus, and IV antibiotics due to suspected contaminated wounds.   Discussed with daughter no indication for admission at this time. She has agreed to return to patient's home with her husband and provide 24 hour assist. She has already initiated the process of setting up home health/nursing.   Patient has follow up with Dermatology tomorrow for some pre-cancerous skin lesions. I discussed strict return precautions. Discharged home with po antibiotics.   Patient and plan of care discussed with Attending physician, Dr. Jodi Mourning.    Final Clinical Impressions(s) / ED Diagnoses   Final diagnoses:  Fall  Skin abrasion  Laceration of skin of left lower leg without complication, initial encounter     New Prescriptions Discharge Medication List as of 08/23/2016  8:46 PM    START taking these medications   Details  cephALEXin (KEFLEX) 250 MG capsule Take 1 capsule (250 mg total) by mouth 4 (four) times daily., Starting Sun 08/23/2016, Print  Wynelle Cleveland, MD 08/24/16 Mike Gip    Blane Ohara, MD 08/26/16 [phone]

## 2016-08-23 NOTE — ED Notes (Signed)
Dr. Marcina Millard gave pts daughter discharge instructions and paperworks.

## 2016-08-23 NOTE — ED Notes (Signed)
As pt stood to put on clothes wound starting bleeding.   MD aware.  Dressing off.  MD assessed, wound redressed with non stick dressing, covered with abd pad, and kerlix.

## 2016-08-23 NOTE — ED Notes (Signed)
Pt wheeled back to room. Pt assisted onto bed and into gown. Pt placed on monitor and provided with warm blankets.

## 2016-08-23 NOTE — ED Triage Notes (Signed)
Pt fell last night around 9pm and remained outside until she was found at 11am this am.  Pt states that she struck her head, she is on elaquis.  Pt denies any LOC.  Pt is alert and oriented at this time.  Pt has multiple abrasions, skin tears and a deep thickness cut on left leg.  Daughter is here with her and states that she gave her a shower pta.  Last tetanus shot unknown.

## 2016-08-23 NOTE — ED Notes (Signed)
Pt missed the hat when providing a urine sample.

## 2016-08-23 NOTE — Discharge Instructions (Signed)
Your work up today was reassuring. You will need your leg sutures removed in 10-12 days. As discussed, continue to work on home health/nursing assistance. Please return to ED with any new or concern

## 2016-08-23 NOTE — ED Notes (Signed)
Pt called to be taken to a room, pt did not answer.

## 2016-08-24 ENCOUNTER — Telehealth: Payer: Self-pay | Admitting: Cardiology

## 2016-08-24 NOTE — Telephone Encounter (Signed)
Left message to call back on cell voice mail , attempt to leave message on home phone unable

## 2016-08-24 NOTE — Telephone Encounter (Signed)
Follow up ° ° ° ° ° °Returning a call to the nurse °

## 2016-08-24 NOTE — Telephone Encounter (Signed)
New message       Pt c/o medication issue:  1. Name of Medication:  eliquis 2. How are you currently taking this medication (dosage and times per day)?  2.5mg  3. Are you having a reaction (difficulty breathing--STAT)?  no 4. What is your medication issue? Pt fell sat.  She is bleeding from her wounds from the fall.  Calling to talk to a nurse regarding regarding possible stopping eliquis or what to do until the wounds heal.  Please advise

## 2016-08-24 NOTE — Telephone Encounter (Signed)
Discussed with dr hochrein, the patient was given the okay to hold eliquis and not take any until her wound has stopped bleeding and is healing. dtr voiced understanding. The patient will continue all other medications.

## 2016-08-24 NOTE — Telephone Encounter (Signed)
Spoke with pt dtr, the patient fell while walking the dog and laid outside for 14 hours until found. She went to the ER and is seeing her PCP today. She has a 15 inch scrape and wound on her left leg. It is constantly oozing and she has a large hematoma. The patient has not taken the eliquis this morning and the dtr questions how long they can hold it for the patient to be able to heal. Will discuss with dr hochrein.

## 2016-09-10 ENCOUNTER — Encounter (HOSPITAL_BASED_OUTPATIENT_CLINIC_OR_DEPARTMENT_OTHER): Payer: Medicare Other | Attending: Internal Medicine

## 2016-09-10 DIAGNOSIS — S81812A Laceration without foreign body, left lower leg, initial encounter: Secondary | ICD-10-CM | POA: Diagnosis not present

## 2016-09-10 DIAGNOSIS — S71101A Unspecified open wound, right thigh, initial encounter: Secondary | ICD-10-CM | POA: Insufficient documentation

## 2016-09-10 DIAGNOSIS — W228XXA Striking against or struck by other objects, initial encounter: Secondary | ICD-10-CM | POA: Diagnosis not present

## 2016-09-10 DIAGNOSIS — I87323 Chronic venous hypertension (idiopathic) with inflammation of bilateral lower extremity: Secondary | ICD-10-CM | POA: Insufficient documentation

## 2016-09-10 DIAGNOSIS — S81811A Laceration without foreign body, right lower leg, initial encounter: Secondary | ICD-10-CM | POA: Insufficient documentation

## 2016-09-10 DIAGNOSIS — Y93K1 Activity, walking an animal: Secondary | ICD-10-CM | POA: Insufficient documentation

## 2016-09-10 DIAGNOSIS — J449 Chronic obstructive pulmonary disease, unspecified: Secondary | ICD-10-CM | POA: Insufficient documentation

## 2016-09-17 DIAGNOSIS — S81811A Laceration without foreign body, right lower leg, initial encounter: Secondary | ICD-10-CM | POA: Diagnosis not present

## 2016-09-21 ENCOUNTER — Ambulatory Visit (INDEPENDENT_AMBULATORY_CARE_PROVIDER_SITE_OTHER): Payer: Medicare Other | Admitting: Internal Medicine

## 2016-09-21 ENCOUNTER — Encounter: Payer: Self-pay | Admitting: Internal Medicine

## 2016-09-21 VITALS — BP 90/54 | HR 54 | Ht 64.0 in | Wt 140.0 lb

## 2016-09-21 DIAGNOSIS — R0609 Other forms of dyspnea: Secondary | ICD-10-CM

## 2016-09-21 DIAGNOSIS — J449 Chronic obstructive pulmonary disease, unspecified: Secondary | ICD-10-CM

## 2016-09-21 MED ORDER — FLUTICASONE-SALMETEROL 250-50 MCG/DOSE IN AEPB: INHALATION_SPRAY | RESPIRATORY_TRACT | 11 refills | Status: DC

## 2016-09-21 MED ORDER — METOPROLOL SUCCINATE ER 25 MG PO TB24: ORAL_TABLET | ORAL | Status: DC

## 2016-09-21 NOTE — Patient Instructions (Addendum)
Ok to resume advair 250 one twice daily when you finsih your BREO  Reduce the metaoprolol  to take one- half daily     If you are satisfied with your treatment plan,  let your doctor know and he/she can either refill your medications or you can return here when your prescription runs out.     If in any way you are not 100% satisfied,  please tell us.  If 100% better, tell your friends!  Pulmonary follow up is as needed

## 2016-09-21 NOTE — Progress Notes (Signed)
Subjective:     Patient ID: Loretta Todd, female   DOB: 26-Mar-1921,    MRN: [id number]     Brief patient profile:  23 yowf  Quit smoking 1998 s resp symptoms and played tennis until age 83 while on advair 250 bid  then acutely in Janurary 2018     Admit date: 01/28/2016 Discharge date: 02/01/2016  Admitted From: Home Disposition: Home  Recommendations for Outpatient Follow-up:  1. Follow up with PCP in 1-2 weeks 2. Patient will complete 7 day course of antibiotics on 1/31  and Tamiflu on 1/29. 3. Patient is being discharged on 2 L via nasal cannula continuously.  Home Health: RN and PT Equipment/Devices: Oxygen (2 L via nasal cannula)     Discharge Diagnoses:  Principal Problem:   Severe sepsis (HCC)   Active Problems:   LBBB (left bundle branch block)   Atrial fibrillation (HCC)   Acute respiratory failure with hypoxia (HCC)   Elbow laceration, right, initial encounter   Lobar pneumonia (HCC)  Brief narrative/history of present illness 83 year old female with history of asthma, osteoarthritis, transient third-degree AV block (transient following finger surgery in 2015 with no recurrence) chronic left bundle-branch block (negative for ischemia on stress test in 2004) who presented to the ED with chills with weakness, increasing shortness of breath worse with exertion and cough and lightheadedness for past few days. In the ED she was septic with hypotension, tachycardia and tachypnea with leukocytosis (WBC 17 K) chest x-ray showed multifocal pneumonia. Lactic acid was elevated. Admitted to stepdown unit for further management. Patient received IV fluid bolus for sepsis protocol in the ED following which she went into acute pulmonary edema. Improved after stopping fluids and given IV Lasix.  Hospital course  Principal Problem: Severe sepsis (HCC) Secondary to multilobar pneumonia and influenza A. Sepsis is resolved. Blood culture on admission growing  Streptococcus species. Added antibiotics. Will discharged on oral Levaquin to complete a seven-day course.  ContinueTamiflu (completes 5 day course after 1/28). Supportive care with Tylenol and antitussives.   Active Problems: New onset atrial fibrillation with RVR Secondary to sepsis.. TSH normal.  2-D echo shows EF of 60-65% with normal wall motion, mild AR and moderate MR. Mildly increased PA pressure (38 mmHg) Stable on monitor past 48 hours occasional heart rate in 120s. Monitor during outpatient follow-up.   Acute respiratory failure with hypoxia (HCC) Secondary to lobar pneumoniaand influenza. Plan as above. Evaluated for home O2 and patient requiring 2 L via nasal cannula both at rest and ambulation.  LBBB (left bundle branch block) Chronic.  Prolonged QTC Avoid QT prolonging agents.  Generalized weakness. Seen by PT and recommend home health    03/24/2016 1st [facility] office visit/ Loretta Todd   Chief Complaint  Patient presents with  . Pulmonary Consult    Referred by Loretta Bash, PA. Pt states had PNA and Flu in Jan 2018 and has had SOB since then. She states she had severe asthma as a child, and then outgrew this as a teen.   doe = MMRC2 = can't walk a nl pace on a flat grade s sob but does fine slow and flat eg HT but not [employer] fatigue and sob both limit about the same time  Also am cough/ wheeze yellow to brown sputum x sev weeks prior to OV   rec Stop advair and start Breo one each am - take two good drags but open/click just once      04/30/2016  f/u ov/Loretta Todd  re:  GOLD  I  Chief Complaint  Patient presents with  . Follow-up    Pt stats her breathing is not improving. She is c/o wheezing and has a non prod cough.   lasix really helped / still has to sit propped up 30 degrees to sleep but this has been her baseline x years Since finished lasix breathing  getting worse again / mostly noted when wake at nl hour has noisy breathing  lasting just a few min p Breo at hs  rec diovan hct  80-12.5 one daily  Please remember to go to the lab and x-ray department downstairs in the basement  for your tests - we will call you with the results when they are available.    NP  05/20/16  May discontinue Oxygen .  Continue on BREO 1 puff daily , rinse after use.  Follow med calendar and bring it to office       09/21/2016  f/u ov/Loretta Todd re:  COPD GOLD I / on Breo/ prefers advair / ? Mild chf  - no med calendar  Chief Complaint  Patient presents with  . Follow-up    Pt states "I do not have a breathing problem, I have a chest cough".  She is coughing up minimal yellow sputum.     overall doing well really not limited but very sedentary/ feels mild head /chest "cold x last 3 days but "nothing unusual and no need for saba   No obvious day to day or daytime variability or assoc excess/ purulent sputum or mucus plugs or hemoptysis or cp or chest tightness, subjective wheeze or overt sinus or hb symptoms. No unusual exp hx or h/o childhood pna/ asthma or knowledge of premature birth.  Sleeping ok flat without nocturnal  or early am exacerbation  of respiratory  c/o's or need for noct saba. Also denies any obvious fluctuation of symptoms with weather or environmental changes or other aggravating or alleviating factors except as outlined above   Current Allergies, Complete Past Medical History, Past Surgical History, Family History, and Social History were reviewed in Loretta Todd record.  ROS  The following are not active complaints unless bolded sore throat, dysphagia, dental problems, itching, sneezing,  nasal congestion or disharge of excess mucus or purulent secretions, ear ache,   fever, chills, sweats, unintended wt loss or wt gain, classically pleuritic or exertional cp,  orthopnea pnd or leg swelling, presyncope, palpitations, abdominal pain, anorexia, nausea, vomiting, diarrhea  or change in bowel habits or  bladder habits, change in stools or change in urine, dysuria, hematuria,  rash, arthralgias, visual complaints, headache, numbness, weakness or ataxia or problems with walking or coordination,  change in mood/affect or memory.        Current Meds  Medication Sig  . acetaminophen (TYLENOL) 325 MG tablet Take 325 mg by mouth every 6 (six) hours as needed for headache (pain).   Marland Kitchen apixaban (ELIQUIS) 2.5 MG TABS tablet Take 1 tablet (2.5 mg total) by mouth 2 (two) times daily.  . metoprolol succinate (TOPROL XL) 25 MG 24 hr tablet One half tablet daily  . valsartan-hydrochlorothiazide (DIOVAN HCT) 80-12.5 MG tablet Take 1 tablet by mouth daily.  . [DISCONTINUED] fluticasone furoate-vilanterol (BREO ELLIPTA) 100-25 MCG/INH AEPB Inhale 1 puff into the lungs every morning. (Patient taking differently: Inhale 1 puff into the lungs daily. )  . [DISCONTINUED] metoprolol succinate (TOPROL XL) 25 MG 24 hr tablet Take 1 tablet (25 mg total) by mouth  daily. (Patient taking differently: Take 25 mg by mouth at bedtime. )                                   Objective:   Physical Exam    amb pleasantly thin wf nad    09/21/2016      140  04/30/16 136 lb 6.4 oz (61.9 kg)  04/01/16 135 lb (61.2 kg)  03/24/16 123 lb (55.8 kg)    Vital signs reviewed  - Note on arrival 02 sats  97% on RA and bp 90/54 and pulse 54      HEENT: nl dentition, turbinates bilaterally, and oropharynx. Nl external ear canals without cough reflex   NECK :  without JVD/Nodes/TM/ nl carotid upstrokes bilaterally   LUNGS: no acc muscle use,  Nl contour chest with sltly dimini   CV:  RRR  no s3 or murmur or increase in P2, and no edema   ABD:  soft and nontender with nl inspiratory excursion in the supine position. No bruits or organomegaly appreciated, bowel sounds nl  MS:  Nl gait/ ext warm without deformities, calf tenderness, cyanosis or clubbing No obvious joint restrictions   SKIN: warm and dry without  lesions    NEURO:  alert, approp, nl sensorium with  no motor or cerebellar deficits apparent.             Assessment:

## 2016-09-22 NOTE — Assessment & Plan Note (Signed)
Quit smoking 1998 - Spirometry 03/24/2016  FEV1 1.27 (85%)  Ratio 64  - 03/24/2016  After extensive coaching HFA effectiveness =    0 % but 90% with dpi so rec change to BREO one click each am   - Allergy profile 04/30/16  >  Eos 0.1/  IgE  128 neg RAST   Well compensated, prefers advair due to cost so ok to resume 250/50 bid and f/u here prn

## 2016-09-22 NOTE — Assessment & Plan Note (Signed)
Echo  01/30/16  Left ventricle: The cavity size was normal. There was mild   concentric hypertrophy. Systolic function was normal. The   estimated ejection fraction was in the range of 60% to 65%. Wall   motion was normal; there were no regional wall motion   abnormalities. - Aortic valve: There was mild regurgitation. Valve area (VTI):   1.11 cm^2. Valve area (Vmax): 1.08 cm^2. Valve area (Vmean): 1.05   cm^2. - Mitral valve: Mildly calcified annulus. There was moderate   regurgitation. - Left atrium: The atrium was mildly dilated. - Tricuspid valve: There was mild-moderate regurgitation. - Pulmonary arteries: Systolic pressure was mildly increased. PA   peak pressure: 38 mm Hg (S).  Added diovan/hct 04/30/2016 > improved 09/21/2016 but bp low /pulse low so rec reduce lopressor to 12.5 mg daily and  Follow up per Primary Care planned

## 2016-09-23 ENCOUNTER — Telehealth: Payer: Self-pay | Admitting: Cardiology

## 2016-09-23 NOTE — Telephone Encounter (Signed)
Noted  

## 2016-09-23 NOTE — Telephone Encounter (Signed)
Follow Up  Cardell Peach is pt daughter. She states she does not need a call back was just letting us know that pt is now halving her metoprolol.

## 2016-09-23 NOTE — Telephone Encounter (Signed)
Returned call to patient's daughter Cardell Peach.She wanted Dr.Hochrein to know mother saw Dr.Wert 09/21/16 he decreased Metoprolol 25 mg 1/2 tablet daily.Advised I will send message to him.

## 2016-09-23 NOTE — Telephone Encounter (Signed)
New message   Pt daughter calling with an rx update.   Pt c/o medication issue:  1. Name of Medication: metoprolol succinate (TOPROL XL) 25 MG 24 hr tablet  2. How are you currently taking this medication (dosage and times per day)?   3. Are you having a reaction (difficulty breathing--STAT)? NONE  4. What is your medication issue? Both pulonologist and PCP have recommended that she cut the dosage in half because her heart rate and BP were low. Pt has already cut this in half.

## 2016-09-23 NOTE — Telephone Encounter (Signed)
Left message-Gay Specht?-no DPR on file

## 2016-09-25 DIAGNOSIS — S81811A Laceration without foreign body, right lower leg, initial encounter: Secondary | ICD-10-CM | POA: Diagnosis not present

## 2016-10-02 DIAGNOSIS — S81811A Laceration without foreign body, right lower leg, initial encounter: Secondary | ICD-10-CM | POA: Diagnosis not present

## 2016-10-16 ENCOUNTER — Encounter (HOSPITAL_BASED_OUTPATIENT_CLINIC_OR_DEPARTMENT_OTHER): Payer: Medicare Other | Attending: Internal Medicine

## 2016-10-16 DIAGNOSIS — I87323 Chronic venous hypertension (idiopathic) with inflammation of bilateral lower extremity: Secondary | ICD-10-CM | POA: Diagnosis not present

## 2016-10-16 DIAGNOSIS — W010XXA Fall on same level from slipping, tripping and stumbling without subsequent striking against object, initial encounter: Secondary | ICD-10-CM | POA: Insufficient documentation

## 2016-10-16 DIAGNOSIS — J449 Chronic obstructive pulmonary disease, unspecified: Secondary | ICD-10-CM | POA: Insufficient documentation

## 2016-10-16 DIAGNOSIS — S81812A Laceration without foreign body, left lower leg, initial encounter: Secondary | ICD-10-CM | POA: Diagnosis not present

## 2016-10-23 DIAGNOSIS — S81812A Laceration without foreign body, left lower leg, initial encounter: Secondary | ICD-10-CM | POA: Diagnosis not present

## 2016-10-26 ENCOUNTER — Ambulatory Visit (INDEPENDENT_AMBULATORY_CARE_PROVIDER_SITE_OTHER): Payer: Medicare Other | Admitting: Podiatry

## 2016-10-26 DIAGNOSIS — B351 Tinea unguium: Secondary | ICD-10-CM | POA: Diagnosis not present

## 2016-10-26 DIAGNOSIS — M79676 Pain in unspecified toe(s): Secondary | ICD-10-CM

## 2016-10-28 NOTE — Progress Notes (Signed)
   SUBJECTIVE Patient presents to office today complaining of elongated, thickened nails. Pain while ambulating in shoes. Patient is unable to trim their own nails.   Past Medical History:  Diagnosis Date  . Arthritis   . Asthma   . Lumbar radiculopathy   . Mitral late systolic murmur   . OA (osteoarthritis)    DIPs and PIPs  . Rotator cuff tendinitis     OBJECTIVE General Patient is awake, alert, and oriented x 3 and in no acute distress. Derm Skin is dry and supple bilateral. Negative open lesions or macerations. Remaining integument unremarkable. Nails are tender, long, thickened and dystrophic with subungual debris, consistent with onychomycosis, 1-5 bilateral. No signs of infection noted. Vasc  DP and PT pedal pulses palpable bilaterally. Temperature gradient within normal limits.  Neuro Epicritic and protective threshold sensation diminished bilaterally.  Musculoskeletal Exam No symptomatic pedal deformities noted bilateral. Muscular strength within normal limits.  ASSESSMENT 1. Onychodystrophic nails 1-5 bilateral with hyperkeratosis of nails.  2. Onychomycosis of nail due to dermatophyte bilateral 3. Pain in foot bilateral  PLAN OF CARE 1. Patient evaluated today.  2. Instructed to maintain good pedal hygiene and foot care.  3. Mechanical debridement of nails 1-5 bilaterally performed using a nail nipper. Filed with dremel without incident.  4. Return to clinic in 3 mos.    Felecia ShellingBrent M. Mistie Adney, DPM [facility]  Dr. Felecia ShellingBrent M. Jalysa Swopes, DPM    [address]                                        [place], [place] [address]                Office [phone]  Fax [phone]) [phone]

## 2016-10-30 DIAGNOSIS — S81812A Laceration without foreign body, left lower leg, initial encounter: Secondary | ICD-10-CM | POA: Diagnosis not present

## 2016-11-06 ENCOUNTER — Encounter (HOSPITAL_BASED_OUTPATIENT_CLINIC_OR_DEPARTMENT_OTHER): Payer: Medicare Other | Attending: Internal Medicine

## 2016-11-06 DIAGNOSIS — J449 Chronic obstructive pulmonary disease, unspecified: Secondary | ICD-10-CM | POA: Insufficient documentation

## 2016-11-06 DIAGNOSIS — S81812A Laceration without foreign body, left lower leg, initial encounter: Secondary | ICD-10-CM | POA: Diagnosis present

## 2016-11-06 DIAGNOSIS — I87323 Chronic venous hypertension (idiopathic) with inflammation of bilateral lower extremity: Secondary | ICD-10-CM | POA: Diagnosis not present

## 2016-11-06 DIAGNOSIS — X58XXXA Exposure to other specified factors, initial encounter: Secondary | ICD-10-CM | POA: Insufficient documentation

## 2016-11-13 DIAGNOSIS — S81812A Laceration without foreign body, left lower leg, initial encounter: Secondary | ICD-10-CM | POA: Diagnosis not present

## 2016-11-23 ENCOUNTER — Ambulatory Visit: Payer: Medicare Other | Admitting: Podiatry

## 2017-01-04 ENCOUNTER — Ambulatory Visit (INDEPENDENT_AMBULATORY_CARE_PROVIDER_SITE_OTHER): Payer: Medicare Other | Admitting: Podiatry

## 2017-01-04 ENCOUNTER — Encounter: Payer: Self-pay | Admitting: Podiatry

## 2017-01-04 DIAGNOSIS — M79676 Pain in unspecified toe(s): Secondary | ICD-10-CM

## 2017-01-04 DIAGNOSIS — B351 Tinea unguium: Secondary | ICD-10-CM | POA: Diagnosis not present

## 2017-01-07 ENCOUNTER — Other Ambulatory Visit: Payer: Self-pay | Admitting: *Deleted

## 2017-01-07 ENCOUNTER — Telehealth: Payer: Self-pay | Admitting: Internal Medicine

## 2017-01-07 ENCOUNTER — Telehealth: Payer: Self-pay | Admitting: Cardiology

## 2017-01-07 MED ORDER — TELMISARTAN-HCTZ 40-12.5 MG PO TABS: 1.0000 | ORAL_TABLET | Freq: Every day | ORAL | 11 refills | Status: DC

## 2017-01-07 NOTE — Telephone Encounter (Signed)
Reviewed. Patient daughter called about replacing Valsartan-HCT d/t recall. I do not see that Dr. Antoine PocheHochrein prescribed this (or it was not reported as being taken at her last OV), she may have been prescribed this medication in April when she saw Dr. Sherene SiresWert. Will seek review by pharmD

## 2017-01-07 NOTE — Telephone Encounter (Signed)
Called and and spoke with pt. Pt is requesting alternative for Diovan, as there is a recall on Diovan.  MW please advise. Thanks.

## 2017-01-07 NOTE — Telephone Encounter (Signed)
Returned call. Advised daughter she will need to contact Dr. Thurston HoleWert's office to get recommendation since the medication was not prescribed by Dr. Antoine PocheHochrein. She verbalized understanding.

## 2017-01-07 NOTE — Telephone Encounter (Signed)
Valsartan-hct prescription is from Dr Sherene SiresWert on 04/2016.  Please contact prescribed for change

## 2017-01-07 NOTE — Telephone Encounter (Signed)
New Message ° °Pt daughter returning RN call  °

## 2017-01-07 NOTE — Telephone Encounter (Signed)
Pt c/o medication issue: 1. Name of Medication: Valsartan 2. How are you currently taking this medication (dosage and times per day)?  3. Are you having a reaction (difficulty breathing--STAT)?  no 4. What is your medication issue? Taking it off the market

## 2017-01-07 NOTE — Telephone Encounter (Signed)
Left msg to call.

## 2017-01-07 NOTE — Telephone Encounter (Signed)
Pt called and diovan discontinued. Micardis ordered.

## 2017-01-07 NOTE — Telephone Encounter (Signed)
micardis 40-12.5 one daily

## 2017-01-25 ENCOUNTER — Other Ambulatory Visit: Payer: Self-pay | Admitting: Cardiology

## 2017-03-08 ENCOUNTER — Other Ambulatory Visit: Payer: Medicare Other

## 2017-03-29 ENCOUNTER — Ambulatory Visit (INDEPENDENT_AMBULATORY_CARE_PROVIDER_SITE_OTHER): Payer: Medicare Other | Admitting: Podiatry

## 2017-03-29 DIAGNOSIS — B351 Tinea unguium: Secondary | ICD-10-CM

## 2017-03-29 DIAGNOSIS — M79676 Pain in unspecified toe(s): Secondary | ICD-10-CM

## 2017-03-31 NOTE — Progress Notes (Signed)
   SUBJECTIVE Patient presents to office today complaining of elongated, thickened nails that cause pain while ambulating in shoes. She is unable to trim her own nails. Patient is here for further evaluation and treatment.   Past Medical History:  Diagnosis Date  . Arthritis   . Asthma   . Lumbar radiculopathy   . Mitral late systolic murmur   . OA (osteoarthritis)    DIPs and PIPs  . Rotator cuff tendinitis     OBJECTIVE General Patient is awake, alert, and oriented x 3 and in no acute distress. Derm Skin is dry and supple bilateral. Negative open lesions or macerations. Remaining integument unremarkable. Nails are tender, long, thickened and dystrophic with subungual debris, consistent with onychomycosis, 1-5 bilateral. No signs of infection noted. Vasc  DP and PT pedal pulses palpable bilaterally. Temperature gradient within normal limits.  Neuro Epicritic and protective threshold sensation diminished bilaterally.  Musculoskeletal Exam No symptomatic pedal deformities noted bilateral. Muscular strength within normal limits.  ASSESSMENT 1. Onychodystrophic nails 1-5 bilateral with hyperkeratosis of nails.  2. Onychomycosis of nail due to dermatophyte bilateral 3. Pain in foot bilateral  PLAN OF CARE 1. Patient evaluated today.  2. Instructed to maintain good pedal hygiene and foot care.  3. Mechanical debridement of nails 1-5 bilaterally performed using a nail nipper. Filed with dremel without incident.  4. Return to clinic in 3 mos.    Felecia ShellingBrent M. Mikaila Grunert, DPM [facility]  Dr. Felecia ShellingBrent M. Rheba Diamond, DPM    [address]                                        [place], [place] [address]                Office [phone]  Fax [phone]

## 2017-04-19 ENCOUNTER — Ambulatory Visit
Admission: RE | Admit: 2017-04-19 | Discharge: 2017-04-19 | Disposition: A | Discharge status: Home / Self Care | Payer: Medicare Other | Source: Ambulatory Visit | Attending: Physician Assistant | Admitting: Physician Assistant

## 2017-04-19 ENCOUNTER — Other Ambulatory Visit: Payer: Self-pay | Admitting: Physician Assistant

## 2017-04-19 DIAGNOSIS — R05 Cough: Secondary | ICD-10-CM

## 2017-04-19 DIAGNOSIS — R0602 Shortness of breath: Secondary | ICD-10-CM

## 2017-04-19 DIAGNOSIS — R059 Cough, unspecified: Secondary | ICD-10-CM

## 2017-05-03 ENCOUNTER — Telehealth: Payer: Self-pay | Admitting: Cardiology

## 2017-05-03 NOTE — Telephone Encounter (Signed)
New message   Patient daughter calling about discoloration in feet. Please call

## 2017-05-03 NOTE — Telephone Encounter (Signed)
Left a message to call back.

## 2017-05-04 ENCOUNTER — Telehealth: Payer: Self-pay | Admitting: Cardiology

## 2017-05-04 NOTE — Telephone Encounter (Signed)
ew Message:    Please call asap,pt is having tightness in her chest.She have been having chest pains,but not at the moment,just a tightness.

## 2017-05-04 NOTE — Telephone Encounter (Signed)
Spoke with pt's daughter and pt has noted chest pain off and on for about a week no other symptoms.Appt  rescheduled to 05-18-17 at 2:00 pm with Corine Shelter PA Will forward to Dr Antoine Poche for review.

## 2017-05-04 NOTE — Telephone Encounter (Signed)
Follow up appt 05-18-17.

## 2017-05-05 NOTE — Telephone Encounter (Signed)
Please make sure that the patient understands that if she is having active chest pain she needs to present to the ED or call 911.

## 2017-05-06 NOTE — Telephone Encounter (Signed)
Pt aware of recommendations and will go to ED if has future episodes or increase in S/S.Per pt no pain at this time and is not severe may happen daily or not at all will continue to monitor ./cy

## 2017-05-17 ENCOUNTER — Ambulatory Visit: Payer: Medicare Other | Admitting: Cardiology

## 2017-05-18 ENCOUNTER — Ambulatory Visit: Payer: Medicare Other | Admitting: Cardiology

## 2017-06-01 ENCOUNTER — Ambulatory Visit: Payer: Medicare Other | Admitting: Physician Assistant

## 2017-06-02 ENCOUNTER — Encounter: Payer: Self-pay | Admitting: Physician Assistant

## 2017-06-02 ENCOUNTER — Ambulatory Visit (INDEPENDENT_AMBULATORY_CARE_PROVIDER_SITE_OTHER): Payer: Medicare Other | Admitting: Physician Assistant

## 2017-06-02 VITALS — BP 126/68 | HR 79 | Ht 64.0 in | Wt 146.0 lb

## 2017-06-02 DIAGNOSIS — I481 Persistent atrial fibrillation: Secondary | ICD-10-CM

## 2017-06-02 DIAGNOSIS — Z7901 Long term (current) use of anticoagulants: Secondary | ICD-10-CM | POA: Diagnosis not present

## 2017-06-02 DIAGNOSIS — I4819 Other persistent atrial fibrillation: Secondary | ICD-10-CM

## 2017-06-02 NOTE — Progress Notes (Signed)
Cardiology Office Note   Date:  06/02/2017   ID:  YARITSA SAVARINO, DOB Jun 01, 1921, MRN [id number]  PCP:  Gwenlyn Found, MD  Cardiologist: Dr. Antoine Poche, 04/01/2016 Theodore Demark, PA-C   No chief complaint on file.   History of Present Illness: Loretta Todd is a 84 y.o. female with a history of PAF (s/p DCCV 03/2016), OA, asthma, mitral SEM, CHA2DS2-VASc = 3 (age x 2, female) on Eliquis and metoprolol, LBBB, transient AV block after surgery  Loretta Todd presents for cardiology follow up.  She had a fall last August, had extensive wounds on both lower legs, L>R. She had to be followed at the wound center for a very long time, the wound is finally healed.  However, because of the injuries received in the amount of time it took the wounds to heal, her deconditioning is significant.  She definitely feels very much weaker than before the fall.  She states that she is to have all kinds of energy and now she does not feel like she can do hardly anything.  She gets tired easily.  She has not had chest pain.  She has not had lower extremity edema, orthopnea or PND.  She is generally not aware of the atrial fibrillation, does not have palpitations.  She does not feel limited in her activity by the atrial fibrillation.    She denies any shortness of breath, palpitations, presyncope or syncope.  Except for the lack of energy since the prolonged recovery from her injuries, she feels she is doing very well.  She never gets chest pain with exertion.  Because of her penchant for doing things that put her at risk for falling, she has an aide during the day, but stays by herself at night.  She has 4 daughters, one is with her today.  They check on her daily.   Past Medical History:  Diagnosis Date  . Asthma   . Lumbar radiculopathy   . Mitral late systolic murmur   . OA (osteoarthritis)    DIPs and PIPs  . Rotator cuff tendinitis     Past Surgical History:  Procedure  Laterality Date  . CARDIOVERSION N/A 03/20/2016   Procedure: CARDIOVERSION;  Surgeon: Quintella Reichert, MD;  Location: [facility];  Service: Cardiovascular;  Laterality: N/A;  . EAR CYST EXCISION Left 06/20/2013   Procedure: DEBRIDE MUCOID CYST/REMOVE LOOSE BODIES LEFT RING FINGER ;  Surgeon: Wyn Forster, MD;  Location: [facility];  Service: Orthopedics;  Laterality: Left;  . EYE SURGERY     both cataracts  . KNEE ARTHROSCOPY W/ MENISCAL REPAIR  2010   right  . TONSILLECTOMY      Current Outpatient Medications  Medication Sig Dispense Refill  . acetaminophen (TYLENOL) 325 MG tablet Take 325 mg by mouth every 6 (six) hours as needed for headache (pain).     Marland Kitchen ELIQUIS 2.5 MG TABS tablet TAKE ONE TABLET TWICE DAILY 60 tablet 3  . Fluticasone-Salmeterol (ADVAIR DISKUS) 250-50 MCG/DOSE AEPB One puff twice daily 60 each 11  . metoprolol succinate (TOPROL XL) 25 MG 24 hr tablet One half tablet daily    . telmisartan-hydrochlorothiazide (MICARDIS HCT) 40-12.5 MG tablet Take 1 tablet by mouth daily. 30 tablet 11   No current facility-administered medications for this visit.     Allergies:   Patient has no known allergies.    Social History:  The patient  reports that she quit smoking about 20 years  ago. Her smoking use included cigarettes. She has a 27.00 pack-year smoking history. She has never used smokeless tobacco. She reports that she drinks alcohol. She reports that she does not use drugs.   Family History:  The patient's family history includes Cancer in her father.    ROS:  Please see the history of present illness. All other systems are reviewed and negative.    PHYSICAL EXAM: VS:  BP 126/68 (BP Location: Left Arm, Patient Position: Sitting, Cuff Size: Normal)   Pulse 79   Ht  (1.626 m)   Wt 146 lb (66.2 kg)   BMI 25.06 kg/m  , BMI Body mass index is 25.06 kg/m. GEN: Well nourished, well developed, female in no acute distress  HEENT: normal for age    Neck: no JVD, no carotid bruit, no masses Cardiac: Irregular rate and rhythm; soft murmur, no rubs, or gallops Respiratory:  clear to auscultation bilaterally, normal work of breathing GI: soft, nontender, nondistended, + BS MS: no deformity or atrophy; no edema; distal pulses are 2+ in all 4 extremities   Skin: warm and dry, no rash; wounds are healed, numerous areas of scarring are noted Neuro:  Strength and sensation are intact Psych: euthymic mood, full affect   EKG:  EKG is ordered today. The ekg ordered today demonstrates atrial fibrillation, left bundle branch block with QRS duration 126 ms, no acute ischemic changes, no significant change from 08/23/2017  ECHO: 01/30/2016 - Left ventricle: The cavity size was normal. There was mild   concentric hypertrophy. Systolic function was normal. The   estimated ejection fraction was in the range of 60% to 65%. Wall   motion was normal; there were no regional wall motion   abnormalities. - Aortic valve: There was mild regurgitation. Valve area (VTI):   1.11 cm^2. Valve area (Vmax): 1.08 cm^2. Valve area (Vmean): 1.05 cm^2. - Mitral valve: Mildly calcified annulus. There was moderate   regurgitation. - Left atrium: The atrium was mildly dilated. - Tricuspid valve: There was mild-moderate regurgitation. - Pulmonary arteries: Systolic pressure was mildly increased. PA   peak pressure: 38 mm Hg (S).    Recent Labs: 08/23/2016: BUN 20; Creatinine, Ser 0.97; Hemoglobin 14.4; Platelets 200; Potassium 3.8; Sodium 133    Lipid Panel No results found for: CHOL, TRIG, HDL, CHOLHDL, VLDL, LDLCALC, LDLDIRECT   Wt Readings from Last 3 Encounters:  06/02/17 146 lb (66.2 kg)  09/21/16 140 lb (63.5 kg)  08/23/16 138 lb (62.6 kg)     Other studies Reviewed: Additional studies/ records that were reviewed today include: Office notes, hospital records and testing.  ASSESSMENT AND PLAN:  1.  Persistent atrial fibrillation: Her rate is  controlled and she is asymptomatic.  She is compliant with the Eliquis.    2.  Chronic anticoagulation: Continue Eliquis for now She has only had one fall in the past year.  Her family has taken measures to make her environment safer by some landscaping changes and she also has a companion during the day for added security.  At this time, do not feel the risk of anticoagulation outweighs the benefits.  This may need to be reassessed if she has additional falls   Current medicines are reviewed at length with the patient today.  The patient does not have concerns regarding medicines.  The following changes have been made:  no change  Labs/ tests ordered today include:  No orders of the defined types were placed in this encounter.  Disposition:   FU with Dr. Antoine Poche  Signed, Theodore Demark, PA-C  06/02/2017 2:00 PM    [facility] Phone: [phone]; Fax: [phone]  This note was written with the assistance of speech recognition software. Please excuse any transcriptional errors.

## 2017-06-02 NOTE — Patient Instructions (Addendum)
Medication Instructions:  Your physician recommends that you continue on your current medications as directed. Please refer to the Current Medication list given to you today.  Follow-Up: Your physician wants you to follow-up in: 1 year with Dr. Antoine Poche. You will receive a reminder letter in the mail two months in advance. If you don't receive a letter, please call our office to schedule the follow-up appointment.      If you need a refill on your cardiac medications before your next appointment, please call your pharmacy.

## 2017-06-05 ENCOUNTER — Other Ambulatory Visit: Payer: Self-pay | Admitting: Cardiology

## 2017-06-07 NOTE — Telephone Encounter (Signed)
REFILL 

## 2017-06-30 ENCOUNTER — Ambulatory Visit (INDEPENDENT_AMBULATORY_CARE_PROVIDER_SITE_OTHER): Payer: Medicare Other | Admitting: Podiatry

## 2017-06-30 ENCOUNTER — Encounter: Payer: Self-pay | Admitting: Podiatry

## 2017-06-30 DIAGNOSIS — M79676 Pain in unspecified toe(s): Secondary | ICD-10-CM | POA: Diagnosis not present

## 2017-06-30 DIAGNOSIS — B351 Tinea unguium: Secondary | ICD-10-CM

## 2017-07-04 NOTE — Progress Notes (Signed)
   SUBJECTIVE Patient presents to office today complaining of elongated, thickened nails that cause pain while ambulating in shoes. She is unable to trim her own nails. Patient is here for further evaluation and treatment.  Past Medical History:  Diagnosis Date  . Asthma   . Lumbar radiculopathy   . Mitral late systolic murmur   . OA (osteoarthritis)    DIPs and PIPs  . Rotator cuff tendinitis     OBJECTIVE General Patient is awake, alert, and oriented x 3 and in no acute distress. Derm Skin is dry and supple bilateral. Negative open lesions or macerations. Remaining integument unremarkable. Nails are tender, long, thickened and dystrophic with subungual debris, consistent with onychomycosis, 1-5 bilateral. No signs of infection noted. Vasc  DP and PT pedal pulses palpable bilaterally. Temperature gradient within normal limits.  Neuro Epicritic and protective threshold sensation grossly intact bilaterally.  Musculoskeletal Exam No symptomatic pedal deformities noted bilateral. Muscular strength within normal limits.  ASSESSMENT 1. Onychodystrophic nails 1-5 bilateral with hyperkeratosis of nails.  2. Onychomycosis of nail due to dermatophyte bilateral 3. Pain in foot bilateral  PLAN OF CARE 1. Patient evaluated today.  2. Instructed to maintain good pedal hygiene and foot care.  3. Mechanical debridement of nails 1-5 bilaterally performed using a nail nipper. Filed with dremel without incident.  4. Return to clinic in 3 mos.    Cashis Rill M. Tomica Arseneault, DPM [facility]  Dr. Jerry Clyne M. Zakyah Yanes, DPM    [address]                                        [place], [place] [address]                Office [phone]  Fax [phone]     

## 2017-07-30 ENCOUNTER — Other Ambulatory Visit: Payer: Self-pay | Admitting: Cardiology

## 2017-08-18 ENCOUNTER — Other Ambulatory Visit: Payer: Self-pay

## 2017-08-18 ENCOUNTER — Ambulatory Visit (INDEPENDENT_AMBULATORY_CARE_PROVIDER_SITE_OTHER): Payer: Medicare Other | Admitting: Podiatry

## 2017-08-18 DIAGNOSIS — B351 Tinea unguium: Secondary | ICD-10-CM | POA: Diagnosis not present

## 2017-08-18 DIAGNOSIS — M79676 Pain in unspecified toe(s): Secondary | ICD-10-CM | POA: Diagnosis not present

## 2017-08-20 NOTE — Progress Notes (Signed)
   SUBJECTIVE Patient presents to office today complaining of elongated, thickened nails that cause pain while ambulating in shoes. She is unable to trim her own nails. Patient is here for further evaluation and treatment.  Past Medical History:  Diagnosis Date  . Asthma   . Lumbar radiculopathy   . Mitral late systolic murmur   . OA (osteoarthritis)    DIPs and PIPs  . Rotator cuff tendinitis     OBJECTIVE General Patient is awake, alert, and oriented x 3 and in no acute distress. Derm Skin is dry and supple bilateral. Negative open lesions or macerations. Remaining integument unremarkable. Nails are tender, long, thickened and dystrophic with subungual debris, consistent with onychomycosis, 1-5 bilateral. No signs of infection noted. Vasc  DP and PT pedal pulses palpable bilaterally. Temperature gradient within normal limits.  Neuro Epicritic and protective threshold sensation grossly intact bilaterally.  Musculoskeletal Exam No symptomatic pedal deformities noted bilateral. Muscular strength within normal limits.  ASSESSMENT 1. Onychodystrophic nails 1-5 bilateral with hyperkeratosis of nails.  2. Onychomycosis of nail due to dermatophyte bilateral 3. Pain in foot bilateral  PLAN OF CARE 1. Patient evaluated today.  2. Instructed to maintain good pedal hygiene and foot care.  3. Mechanical debridement of nails 1-5 bilaterally performed using a nail nipper. Filed with dremel without incident.  4. Return to clinic in 3 mos.    Felecia ShellingBrent M. Evans, DPM [facility]  Dr. Felecia ShellingBrent M. Evans, DPM    [address]                                        [place], [place] [address]                Office [phone]) [phone]  Fax [phone]) [phone]

## 2017-09-25 ENCOUNTER — Other Ambulatory Visit: Payer: Self-pay | Admitting: Cardiology

## 2017-09-27 NOTE — Telephone Encounter (Signed)
Labs per Care Everywhere Scr= 1.01

## 2017-09-29 ENCOUNTER — Ambulatory Visit: Payer: Medicare Other | Admitting: Podiatry

## 2017-10-05 ENCOUNTER — Encounter (HOSPITAL_BASED_OUTPATIENT_CLINIC_OR_DEPARTMENT_OTHER): Payer: Medicare Other | Attending: Internal Medicine

## 2017-10-05 DIAGNOSIS — I87301 Chronic venous hypertension (idiopathic) without complications of right lower extremity: Secondary | ICD-10-CM | POA: Diagnosis not present

## 2017-10-05 DIAGNOSIS — W109XXA Fall (on) (from) unspecified stairs and steps, initial encounter: Secondary | ICD-10-CM | POA: Diagnosis not present

## 2017-10-05 DIAGNOSIS — S81811A Laceration without foreign body, right lower leg, initial encounter: Secondary | ICD-10-CM | POA: Diagnosis not present

## 2017-10-05 DIAGNOSIS — J449 Chronic obstructive pulmonary disease, unspecified: Secondary | ICD-10-CM | POA: Diagnosis not present

## 2017-10-05 DIAGNOSIS — Z87891 Personal history of nicotine dependence: Secondary | ICD-10-CM | POA: Insufficient documentation

## 2017-10-14 DIAGNOSIS — S81811A Laceration without foreign body, right lower leg, initial encounter: Secondary | ICD-10-CM | POA: Diagnosis not present

## 2017-10-25 DIAGNOSIS — S81811A Laceration without foreign body, right lower leg, initial encounter: Secondary | ICD-10-CM | POA: Diagnosis not present

## 2017-11-02 DIAGNOSIS — S81811A Laceration without foreign body, right lower leg, initial encounter: Secondary | ICD-10-CM | POA: Diagnosis not present

## 2017-11-16 ENCOUNTER — Encounter (HOSPITAL_BASED_OUTPATIENT_CLINIC_OR_DEPARTMENT_OTHER): Payer: Medicare Other | Attending: Internal Medicine

## 2017-11-16 DIAGNOSIS — J449 Chronic obstructive pulmonary disease, unspecified: Secondary | ICD-10-CM | POA: Diagnosis not present

## 2017-11-16 DIAGNOSIS — I87311 Chronic venous hypertension (idiopathic) with ulcer of right lower extremity: Secondary | ICD-10-CM | POA: Insufficient documentation

## 2017-11-16 DIAGNOSIS — L97812 Non-pressure chronic ulcer of other part of right lower leg with fat layer exposed: Secondary | ICD-10-CM | POA: Insufficient documentation

## 2017-11-17 ENCOUNTER — Encounter: Payer: Self-pay | Admitting: Podiatry

## 2017-11-17 ENCOUNTER — Ambulatory Visit (INDEPENDENT_AMBULATORY_CARE_PROVIDER_SITE_OTHER): Payer: Medicare Other | Admitting: Podiatry

## 2017-11-17 DIAGNOSIS — B351 Tinea unguium: Secondary | ICD-10-CM

## 2017-11-17 DIAGNOSIS — M79676 Pain in unspecified toe(s): Secondary | ICD-10-CM | POA: Diagnosis not present

## 2017-11-17 NOTE — Progress Notes (Signed)
Subjective: "My left great toe hurts in the outside corner next to the second toe." Loretta Todd presents today with cc of painful, discolored, thick toenails which interfere with daily activities and routine tasks.  Pain is aggravated when wearing enclosed shoe gear. Pain is getting progressively worse and relieved with periodic professional debridement.  Objective: Vascular Examination: Capillary refill time immediate x 10 digits Dorsalis pedis and posterior tibial pulses present b/l No digital hair x 10 digits Skin temperature warm to warm b/l  Dermatological Examination: Skin thin and atrophic b/l No open wounds b/l No interdigital macerations b/l Toenails 1-5 b/l discolored, thick, dystrophic with subungual debris and pain with palpation to nailbeds due to thickness of nails. There is tenderness to palpation of left great toe at the lateral border. No erythema, no edema, no drainage.   Musculoskeletal: Muscle strength 5/5 to all LE muscle groups  Neurological: Sensation intact with 10 gram monofilament. Vibratory sensation intact.  Assessment: Painful onychomycosis toenails 1-5 b/l   Plan: 1. Toenails 1-5 b/l were debrided in length and girth without iatrogenic bleeding. Patient noted relief of pain symptoms post-debridement. 2. Patient to continue soft, supportive shoe gear 3. Patient to report any pedal injuries to medical professional immediately. 4. Follow up 3 months. Patient/POA to call should there be a concern in the interim.

## 2017-11-17 NOTE — Patient Instructions (Signed)

## 2017-11-22 ENCOUNTER — Other Ambulatory Visit: Payer: Self-pay | Admitting: Cardiology

## 2017-12-06 ENCOUNTER — Encounter (HOSPITAL_BASED_OUTPATIENT_CLINIC_OR_DEPARTMENT_OTHER): Payer: Medicare Other | Attending: Internal Medicine

## 2017-12-06 DIAGNOSIS — Z09 Encounter for follow-up examination after completed treatment for conditions other than malignant neoplasm: Secondary | ICD-10-CM | POA: Insufficient documentation

## 2017-12-06 DIAGNOSIS — I87301 Chronic venous hypertension (idiopathic) without complications of right lower extremity: Secondary | ICD-10-CM | POA: Diagnosis not present

## 2017-12-06 DIAGNOSIS — D692 Other nonthrombocytopenic purpura: Secondary | ICD-10-CM | POA: Insufficient documentation

## 2017-12-06 DIAGNOSIS — J449 Chronic obstructive pulmonary disease, unspecified: Secondary | ICD-10-CM | POA: Diagnosis not present

## 2017-12-06 DIAGNOSIS — Z872 Personal history of diseases of the skin and subcutaneous tissue: Secondary | ICD-10-CM | POA: Insufficient documentation

## 2017-12-16 ENCOUNTER — Other Ambulatory Visit: Payer: Self-pay | Admitting: Internal Medicine

## 2017-12-17 ENCOUNTER — Telehealth: Payer: Self-pay | Admitting: Internal Medicine

## 2017-12-17 MED ORDER — FLUTICASONE-SALMETEROL 250-50 MCG/DOSE IN AEPB: INHALATION_SPRAY | RESPIRATORY_TRACT | 0 refills | Status: AC

## 2017-12-17 NOTE — Telephone Encounter (Signed)
Refill has been sent. Nothing further needed.

## 2018-01-06 ENCOUNTER — Other Ambulatory Visit: Payer: Self-pay | Admitting: Internal Medicine

## 2018-01-11 ENCOUNTER — Telehealth: Payer: Self-pay | Admitting: Internal Medicine

## 2018-01-11 MED ORDER — TELMISARTAN-HCTZ 40-12.5 MG PO TABS: 1.0000 | ORAL_TABLET | Freq: Every day | ORAL | 0 refills | Status: DC

## 2018-01-11 NOTE — Telephone Encounter (Signed)
Ok to refill x one but then either needs to see our NP or her PCP prior to refill as we have neot seen her in over a year

## 2018-01-11 NOTE — Telephone Encounter (Signed)
Called and spoke with [facility]. Patient is needing a refill of Telmisartan Hydrochlorothiazide. Refill sent in for one month. MW please advise if this needs to be deferred to PCP or if you will continue to prescribe since it is pulmonary follow up as needed. Thank you.

## 2018-01-11 NOTE — Telephone Encounter (Signed)
Called and spoke with patient, she is aware and has scheduled an appointment. Nothing further needed.

## 2018-01-18 ENCOUNTER — Encounter: Payer: Self-pay | Admitting: Internal Medicine

## 2018-01-18 ENCOUNTER — Ambulatory Visit (INDEPENDENT_AMBULATORY_CARE_PROVIDER_SITE_OTHER): Payer: Medicare Other | Admitting: Internal Medicine

## 2018-01-18 VITALS — BP 114/68 | HR 70 | Ht 64.0 in | Wt 141.0 lb

## 2018-01-18 DIAGNOSIS — J449 Chronic obstructive pulmonary disease, unspecified: Secondary | ICD-10-CM

## 2018-01-18 DIAGNOSIS — I10 Essential (primary) hypertension: Secondary | ICD-10-CM

## 2018-01-18 LAB — BASIC METABOLIC PANEL
BUN: 30 mg/dL — ABNORMAL HIGH (ref 6–23)
CO2: 25 mEq/L (ref 19–32)
Calcium: 9.8 mg/dL (ref 8.4–10.5)
Chloride: 104 mEq/L (ref 96–112)
Creatinine, Ser: 1.08 mg/dL (ref 0.40–1.20)
GFR: 50.2 mL/min — ABNORMAL LOW (ref >60.00)
Glucose, Bld: 118 mg/dL — ABNORMAL HIGH (ref 70–99)
Potassium: 4.7 mEq/L (ref 3.5–5.1)
Sodium: 138 mEq/L (ref 135–145)

## 2018-01-18 MED ORDER — TELMISARTAN-HCTZ 40-12.5 MG PO TABS: 1.0000 | ORAL_TABLET | Freq: Every day | ORAL | 2 refills | Status: DC

## 2018-01-18 NOTE — Patient Instructions (Addendum)
We will refill your micardis for 3 months   Please remember to go to the lab department   for your tests - we will call you with the results when they are available.        If you are satisfied with your treatment plan,  let your doctor know and he/she can either refill your medications or you can return here when your prescription runs out.     If in any way you are not 100% satisfied,  please tell us.  If 100% better, tell your friends!  Pulmonary follow up is as needed

## 2018-01-18 NOTE — Progress Notes (Signed)
Subjective:     Patient ID: Loretta Todd, female   DOB: 20-Nov-1921,    MRN: [id number]     Brief patient profile:  85  yowf  Quit smoking 1998 s resp symptoms and played tennis until age 85 while on advair 250 bid  then acutely in Janurary 2018 admitted:   Admit date: 01/28/2016 Discharge date: 02/01/2016  Admitted From: Home Disposition: Home  Recommendations for Outpatient Follow-up:  1. Follow up with PCP in 1-2 weeks 2. Patient will complete 7 day course of antibiotics on 1/31  and Tamiflu on 1/29. 3. Patient is being discharged on 2 L via nasal cannula continuously.  Home Health: RN and PT Equipment/Devices: Oxygen (2 L via nasal cannula)     Discharge Diagnoses:  Principal Problem:   Severe sepsis (HCC)   Active Problems:   LBBB (left bundle branch block)   Atrial fibrillation (HCC)   Acute respiratory failure with hypoxia (HCC)   Elbow laceration, right, initial encounter   Lobar pneumonia (HCC)  Brief narrative/history of present illness 85 year old female with history of asthma, osteoarthritis, transient third-degree AV block (transient following finger surgery in 2015 with no recurrence) chronic left bundle-branch block (negative for ischemia on stress test in 2004) who presented to the ED with chills with weakness, increasing shortness of breath worse with exertion and cough and lightheadedness for past few days. In the ED she was septic with hypotension, tachycardia and tachypnea with leukocytosis (WBC 17 K) chest x-ray showed multifocal pneumonia. Lactic acid was elevated. Admitted to stepdown unit for further management. Patient received IV fluid bolus for sepsis protocol in the ED following which she went into acute pulmonary edema. Improved after stopping fluids and given IV Lasix.  Hospital course  Principal Problem: Severe sepsis (HCC) Secondary to multilobar pneumonia and influenza A. Sepsis is resolved. Blood culture on admission  growing Streptococcus species. Added antibiotics. Will discharged on oral Levaquin to complete a seven-day course.  ContinueTamiflu (completes 5 day course after 1/28). Supportive care with Tylenol and antitussives.   Active Problems: New onset atrial fibrillation with RVR Secondary to sepsis.. TSH normal.  2-D echo shows EF of 60-65% with normal wall motion, mild AR and moderate MR. Mildly increased PA pressure (38 mmHg) Stable on monitor past 48 hours occasional heart rate in 120s. Monitor during outpatient follow-up.   Acute respiratory failure with hypoxia (HCC) Secondary to lobar pneumoniaand influenza. Plan as above. Evaluated for home O2 and patient requiring 2 L via nasal cannula both at rest and ambulation.  LBBB (left bundle branch block) Chronic.  Prolonged QTC Avoid QT prolonging agents.  Generalized weakness. Seen by PT and recommend home health    03/24/2016 1st [facility] office visit/ Wert   Chief Complaint  Patient presents with  . Pulmonary Consult    Referred by Rueben Bash, PA. Pt states had PNA and Flu in Jan 2018 and has had SOB since then. She states she had severe asthma as a child, and then outgrew this as a teen.   doe = MMRC2 = can't walk a nl pace on a flat grade s sob but does fine slow and flat eg HT but not [employer] fatigue and sob both limit about the same time  Also am cough/ wheeze yellow to brown sputum x sev weeks prior to OV   rec Stop advair and start Breo one each am - take two good drags but open/click just once      04/30/2016  f/u ov/Wert  re:  GOLD  I  Chief Complaint  Patient presents with  . Follow-up    Pt stats her breathing is not improving. She is c/o wheezing and has a non prod cough.   lasix really helped / still has to sit propped up 30 degrees to sleep but this has been her baseline x years Since finished lasix breathing  getting worse again / mostly noted when wake at nl hour has noisy  breathing lasting just a few min p Breo at hs  rec diovan hct  80-12.5 one daily      01/18/2018  f/u ov/Wert re:  GOLD I copd  On advair maint/ needs arb refilled  Chief Complaint  Patient presents with  . Follow-up    she is asking for a refill on her BP med. She never checked with her PCP for refill on this. Her breathing is doing well and she denies any respiratory co's.   Dyspnea:  Not limited by breathing from desired activities  But very sedentary  Cough: none Sleeping: fine flat SABA use: none 02: none   No obvious day to day or daytime variability or assoc excess/ purulent sputum or mucus plugs or hemoptysis or cp or chest tightness, subjective wheeze or overt sinus or hb symptoms.   Sleeping as above without nocturnal  or early am exacerbation  of respiratory  c/o's or need for noct saba. Also denies any obvious fluctuation of symptoms with weather or environmental changes or other aggravating or alleviating factors except as outlined above   No unusual exposure hx or h/o childhood pna/ asthma or knowledge of premature birth.  Current Allergies, Complete Past Medical History, Past Surgical History, Family History, and Social History were reviewed in [employer] record.  ROS  The following are not active complaints unless bolded Hoarseness, sore throat, dysphagia, dental problems, itching, sneezing,  nasal congestion or discharge of excess mucus or purulent secretions, ear ache,   fever, chills, sweats, unintended wt loss or wt gain, classically pleuritic or exertional cp,  orthopnea pnd or arm/hand swelling  or leg swelling, presyncope, palpitations, abdominal pain, anorexia, nausea, vomiting, diarrhea  or change in bowel habits or change in bladder habits, change in stools or change in urine, dysuria, hematuria,  rash, arthralgias, visual complaints, headache, numbness, weakness or ataxia or problems with walking or coordination,  change in mood or   memory.        Current Meds  Medication Sig  . ELIQUIS 2.5 MG TABS tablet TAKE ONE TABLET TWICE DAILY  . Fluticasone-Salmeterol (ADVAIR DISKUS) 250-50 MCG/DOSE AEPB INHALE TWICE DAILY  . metoprolol succinate (TOPROL-XL) 25 MG 24 hr tablet TAKE ONE TABLET EACH DAY  . oxyCODONE (OXY IR/ROXICODONE) 5 MG immediate release tablet TAKE 1 TO 2 TABLETS EVERY 6 HOURS AS NEEDED FOR PAIN  . predniSONE (DELTASONE) 5 MG tablet Take by mouth.  . telmisartan-hydrochlorothiazide (MICARDIS HCT) 40-12.5 MG tablet Take 1 tablet by mouth daily.                          Objective:   Physical Exam  amb pleasant wf   01/18/2018        141   09/21/2016      140  04/30/16 136 lb 6.4 oz (61.9 kg)  04/01/16 135 lb (61.2 kg)  03/24/16 123 lb (55.8 kg)       Vital signs reviewed - Note on arrival 02 sats  96%  on RA and bp 114/68     HEENT: nl dentition / oropharynx. Nl external ear canals without cough reflex -  Mild bilateral non-specific turbinate edema     NECK :  without JVD/Nodes/TM/ nl carotid upstrokes bilaterally   LUNGS: no acc muscle use,  Mild barrel/kyphotic   contour chest wall with bilateral sltly Distant bs s audible wheeze and  without cough on insp or exp maneuver and mild  Hyperresonant  to  percussion bilaterally     CV:  RRR  no s3 or murmur or increase in P2, and no edema   ABD:  soft and nontender with pos late insp Hoover's  in the supine position. No bruits or organomegaly appreciated, bowel sounds nl  MS:   Nl gait/  ext warm without deformities, calf tenderness, cyanosis or clubbing No obvious joint restrictions   SKIN: warm and dry without lesions    NEURO:  alert, approp, nl sensorium with  no motor or cerebellar deficits apparent.            I personally reviewed images and agree with radiology impression as follows:  CXR:   04/19/17 No evidence of acute cardiopulmonary disease.     Chemistry      Component Value Date/Time   NA 138 01/18/2018 1520    K 4.7 01/18/2018 1520   CL 104 01/18/2018 1520   CO2 25 01/18/2018 1520   BUN 30 (H) 01/18/2018 1520   CREATININE 1.08 01/18/2018 1520   CREATININE 0.82 03/09/2016 1011      Component Value Date/Time   CALCIUM 9.8 01/18/2018 1520      01/28/2016 1639     01/28/2016 1639     01/28/2016 1639     01/28/2016 1639       Assessment:

## 2018-01-18 NOTE — Progress Notes (Signed)
Spoke with pt and notified of results per Dr. Wert. Pt verbalized understanding and denied any questions. 

## 2018-01-19 ENCOUNTER — Encounter: Payer: Self-pay | Admitting: Internal Medicine

## 2018-01-19 NOTE — Assessment & Plan Note (Addendum)
Changed to ARB  04/30/16 due to cough> resolved   Lab Results  Component Value Date   CREATININE 1.08 01/18/2018   CREATININE 0.97 08/23/2016   CREATININE 0.74 04/30/2016   CREATININE 0.82 03/09/2016     Adequate control on present rx, reviewed in detail with pt > no change in rx needed    Although even in retrospect it may not be clear the ACEi contributed to the pt's symptoms,  Pt improved off them and adding them back at this point or in the future would risk confusion in interpretation of non-specific respiratory symptoms to which this patient is prone  ie  Better not to muddy the waters here.   >>>> rec refill micardis 40-12.5 one daily x 3 months and further refills or substitutions  per PCP

## 2018-01-19 NOTE — Assessment & Plan Note (Addendum)
Quit smoking 1998 - Spirometry 03/24/2016  FEV1 1.27 (85%)  Ratio 64  - 03/24/2016  After extensive coaching HFA effectiveness =    0 % but 90% with dpi so rec change to BREO one click each am > preferred advair 250   - Allergy profile 04/30/16  >  Eos 0.1/  IgE  128 neg RAST   She has only mild copd, not limited from desired activities or having aecopd on advair 250 bid so no change needed and f/u in pulmonary clinic can be prn    Please see AVS for specific instructions unique to this visit that I personally wrote and verbalized to the the pt in detail and then reviewed with pt  by my nurse highlighting any  changes in therapy recommended at today's visit to their plan of care.

## 2018-02-17 ENCOUNTER — Ambulatory Visit (INDEPENDENT_AMBULATORY_CARE_PROVIDER_SITE_OTHER): Payer: Medicare Other | Admitting: Podiatry

## 2018-02-17 DIAGNOSIS — B351 Tinea unguium: Secondary | ICD-10-CM

## 2018-02-17 DIAGNOSIS — M79676 Pain in unspecified toe(s): Secondary | ICD-10-CM | POA: Diagnosis not present

## 2018-02-17 DIAGNOSIS — Z9229 Personal history of other drug therapy: Secondary | ICD-10-CM

## 2018-02-17 NOTE — Patient Instructions (Signed)

## 2018-02-21 ENCOUNTER — Encounter: Payer: Self-pay | Admitting: Podiatry

## 2018-02-21 NOTE — Progress Notes (Signed)
Subjective: Loretta Todd is a 85 y.o. y.o. female who presents today with painful, discolored, thick toenails  which interfere with daily activities. Pain is aggravated when wearing enclosed shoe gear. Pain is relieved with periodic professional debridement.   Patient's PCP is Gwenlyn Found, MD and last date seen was 08/09/2017.  Patient is also on Eliquis.  She voices no new pedal concerns on today's visit.   Current Outpatient Medications:  .  ELIQUIS 2.5 MG TABS tablet, TAKE ONE TABLET TWICE DAILY, Disp: 60 tablet, Rfl: 6 .  Fluticasone-Salmeterol (ADVAIR DISKUS) 250-50 MCG/DOSE AEPB, INHALE TWICE DAILY, Disp: 60 each, Rfl: 0 .  metoprolol succinate (TOPROL-XL) 25 MG 24 hr tablet, TAKE ONE TABLET EACH DAY, Disp: 90 tablet, Rfl: 3 .  oxyCODONE (OXY IR/ROXICODONE) 5 MG immediate release tablet, TAKE 1 TO 2 TABLETS EVERY 6 HOURS AS NEEDED FOR PAIN, Disp: , Rfl:  .  predniSONE (DELTASONE) 5 MG tablet, Take by mouth., Disp: , Rfl:  .  telmisartan-hydrochlorothiazide (MICARDIS HCT) 40-12.5 MG tablet, Take 1 tablet by mouth daily., Disp: 30 tablet, Rfl: 2   No Known Allergies   Objective: Vascular Examination: Capillary refill time immediate x 10 digits Dorsalis pedis pulses present b/l Posterior tibial pulses present b/l No digital hair x 10 digits Skin temperature gradient WNL b/l  Dermatological Examination: Skin thin and atrophic b/l   Toenails 1-5 b/l discolored, thick, dystrophic with subungual debris and pain with palpation to nailbeds due to thickness of nails.  No open wounds b/l  No interdigital macerations b/l  Musculoskeletal: Muscle strength 5/5 to all LE muscle groups  Neurological: Sensation intact with 10 gram monofilament.  Vibratory sensation intact.  Assessment: Painful onychomycosis toenails 1-5 b/l in patient on blood thinner, Eliquis.  Plan: 1. Toenails 1-5 b/l were debrided in length and girth without iatrogenic bleeding. 2. Patient to  continue soft, supportive shoe gear 3. Patient to report any pedal injuries to medical professional immediately. 4. Avoid self trimming due to use of blood thinner. 5. Follow up 3 months.  6. Patient/POA to call should there be a concern in the interim.

## 2018-04-18 ENCOUNTER — Telehealth: Payer: Self-pay | Admitting: Podiatry

## 2018-04-18 NOTE — Telephone Encounter (Signed)
Pt daughter called and wanted an appointment. Patient is not a diabetic/no pain in her nails/no broken skin. I asked that she keep her appointment for 05/19/2018 with Dr. Eloy End and if she has any other issues that she gives our office a call.

## 2018-04-22 IMAGING — DX DG CHEST 2V
3 series · 3 of 3 positions shown · non-contrast
Comparison: 04/30/2016 chest radiograph.

CLINICAL DATA: Fall

EXAM:
CHEST  2 VIEW

[x chest ap]
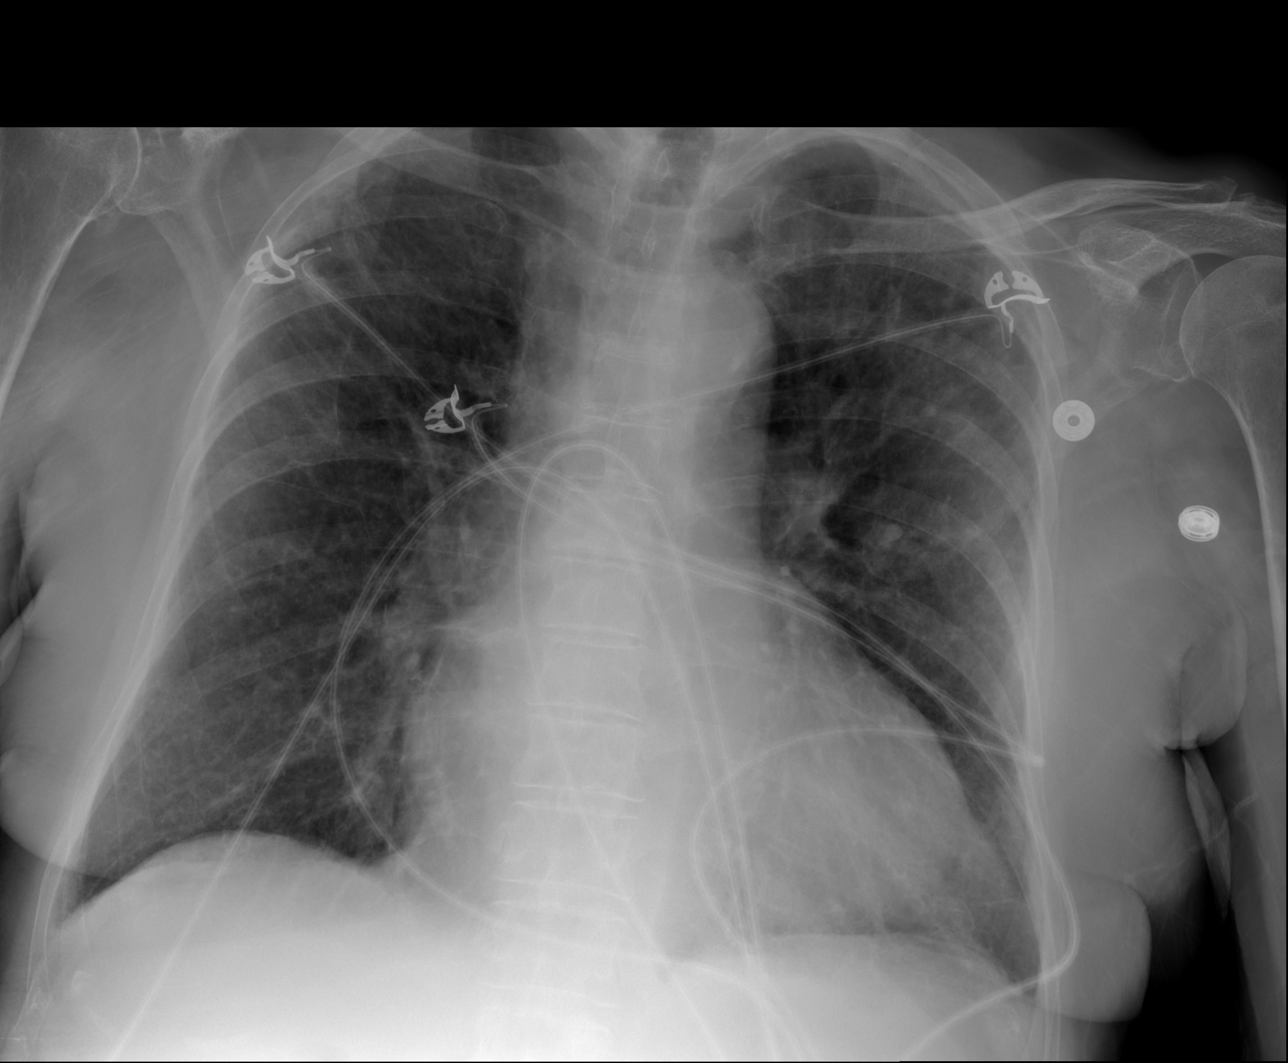

[w chest lat (1 of 2)]
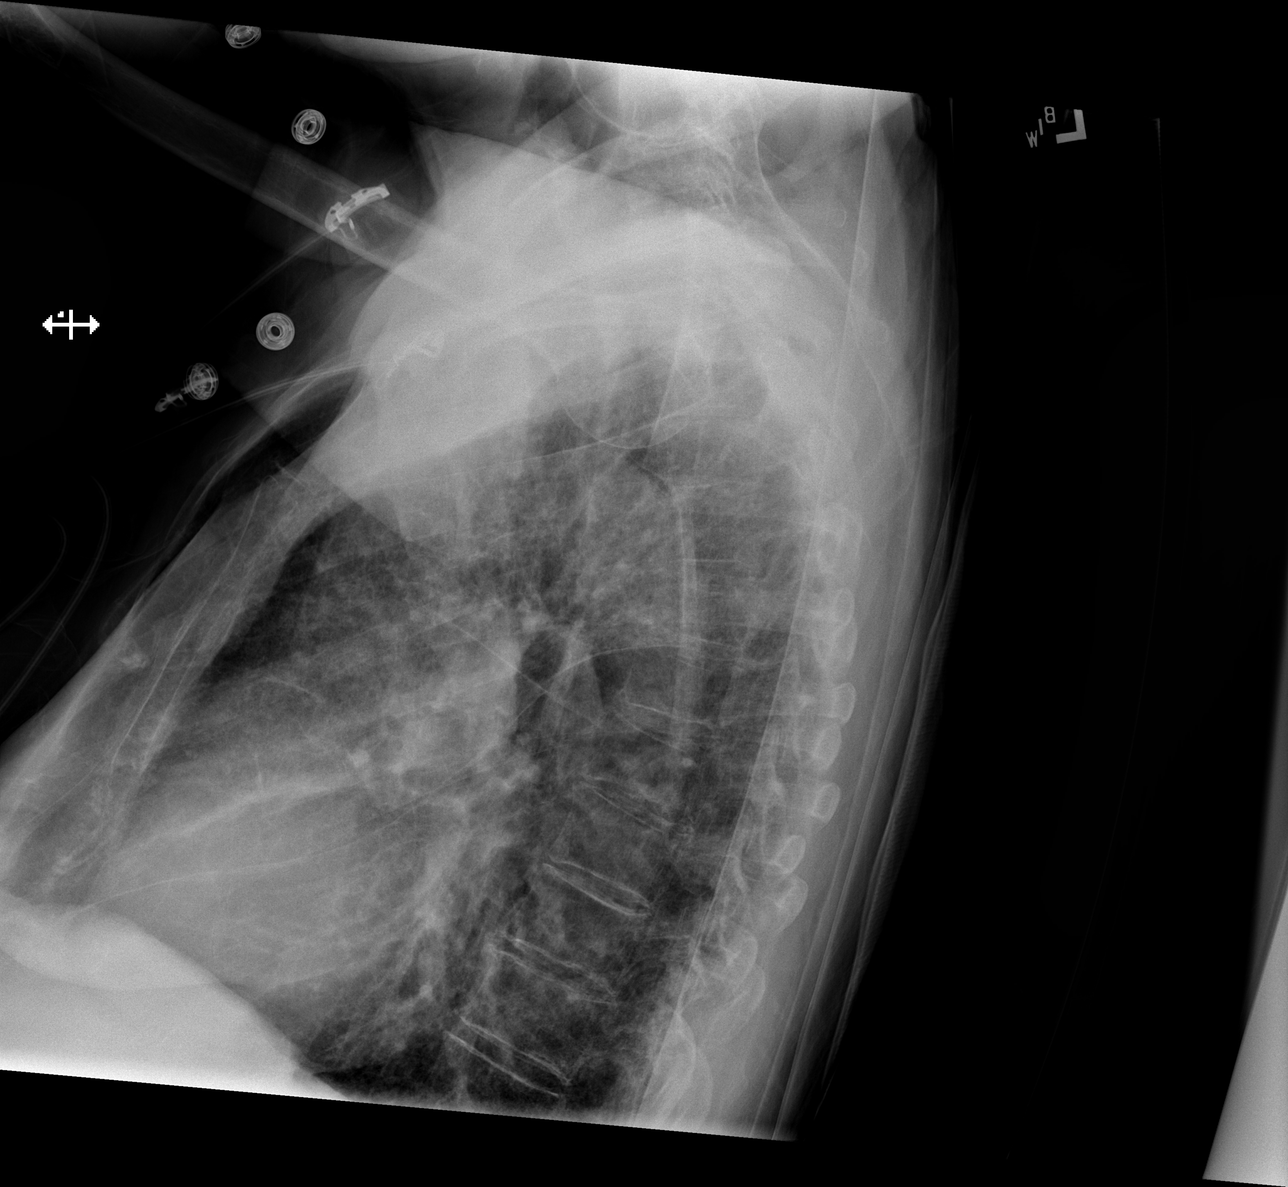

[w chest lat (2 of 2)]
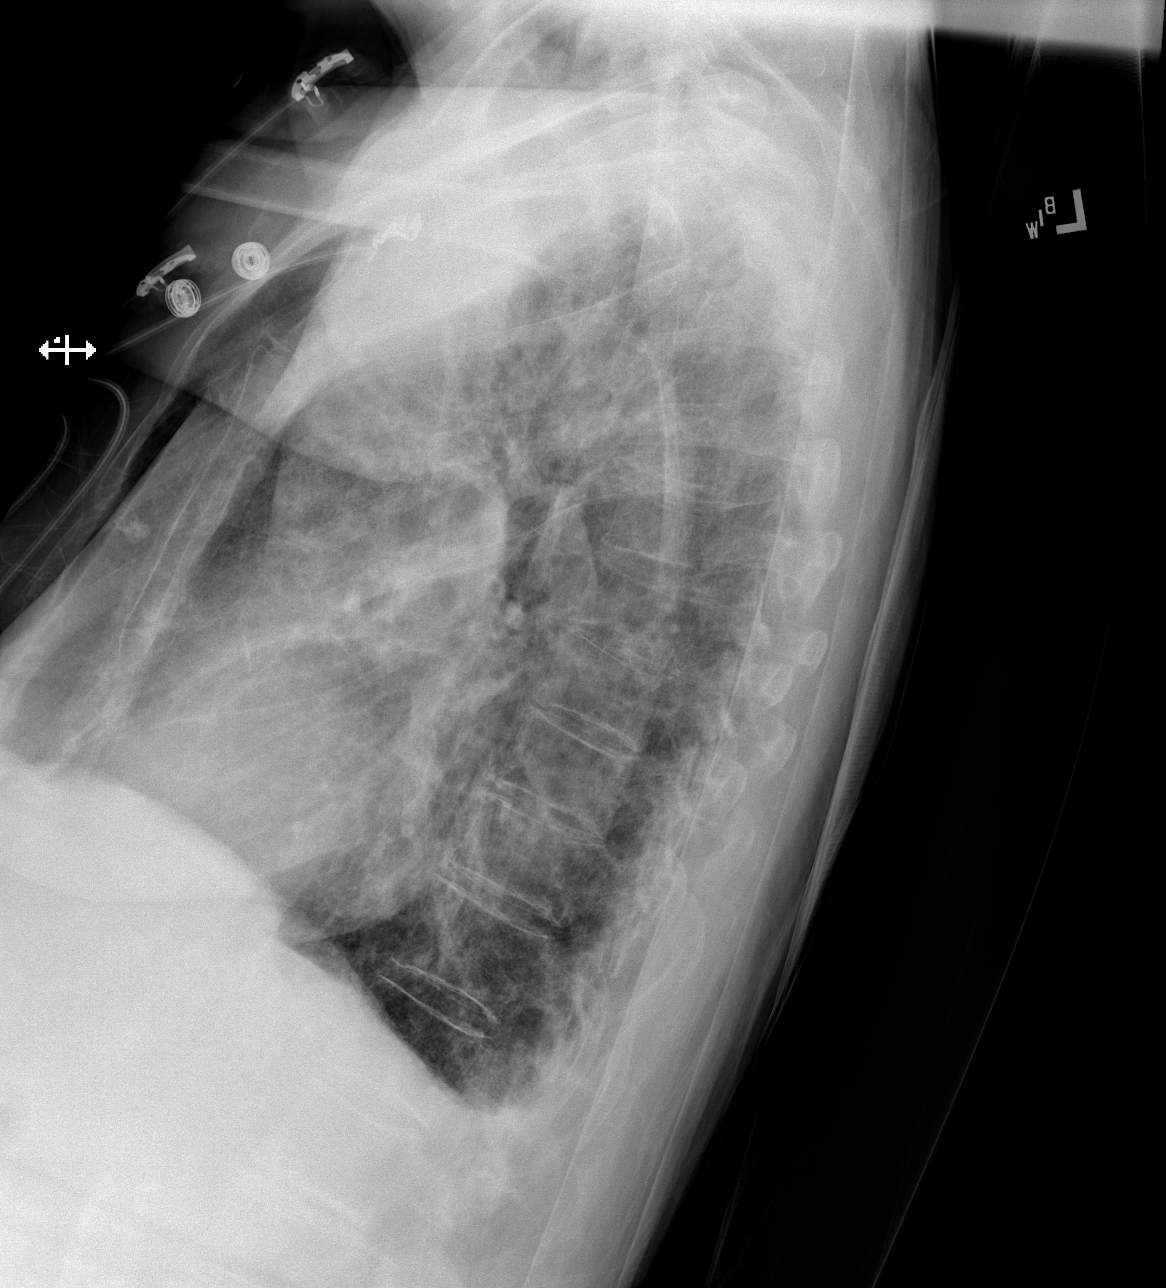

[3 of 3 positions shown; findings below may reference images not displayed]

FINDINGS: Stable cardiomediastinal silhouette with mild cardiomegaly and
aortic atherosclerosis. No pneumothorax. No pleural effusion.
Hyperinflated lungs. Emphysema. No overt pulmonary edema. No acute
consolidative airspace disease. No displaced fractures in the chest.
IMPRESSION: 1. No acute disease in the chest.
2. Hyperinflated lungs and emphysema, suggesting COPD.
3. Mild cardiomegaly without overt pulmonary edema.

## 2018-04-22 IMAGING — DX DG ELBOW COMPLETE 3+V*L*
4 series · 4 of 4 positions shown · non-contrast
Comparison: None.

CLINICAL DATA: Recent fall with pain and soft tissue injuries,
initial encounter

EXAM:
LEFT ELBOW - COMPLETE 3+ VIEW

[x elbow ap left]
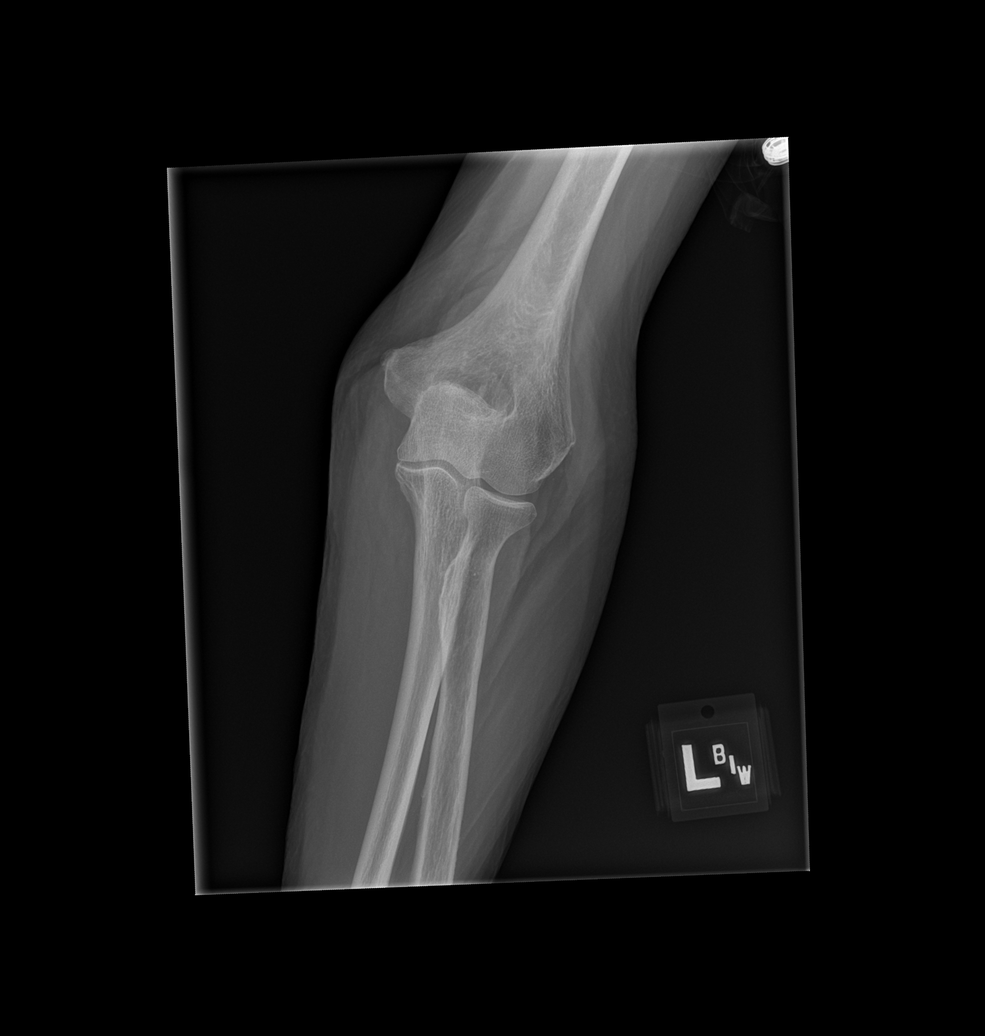

[x elbow obl left (1 of 2)]
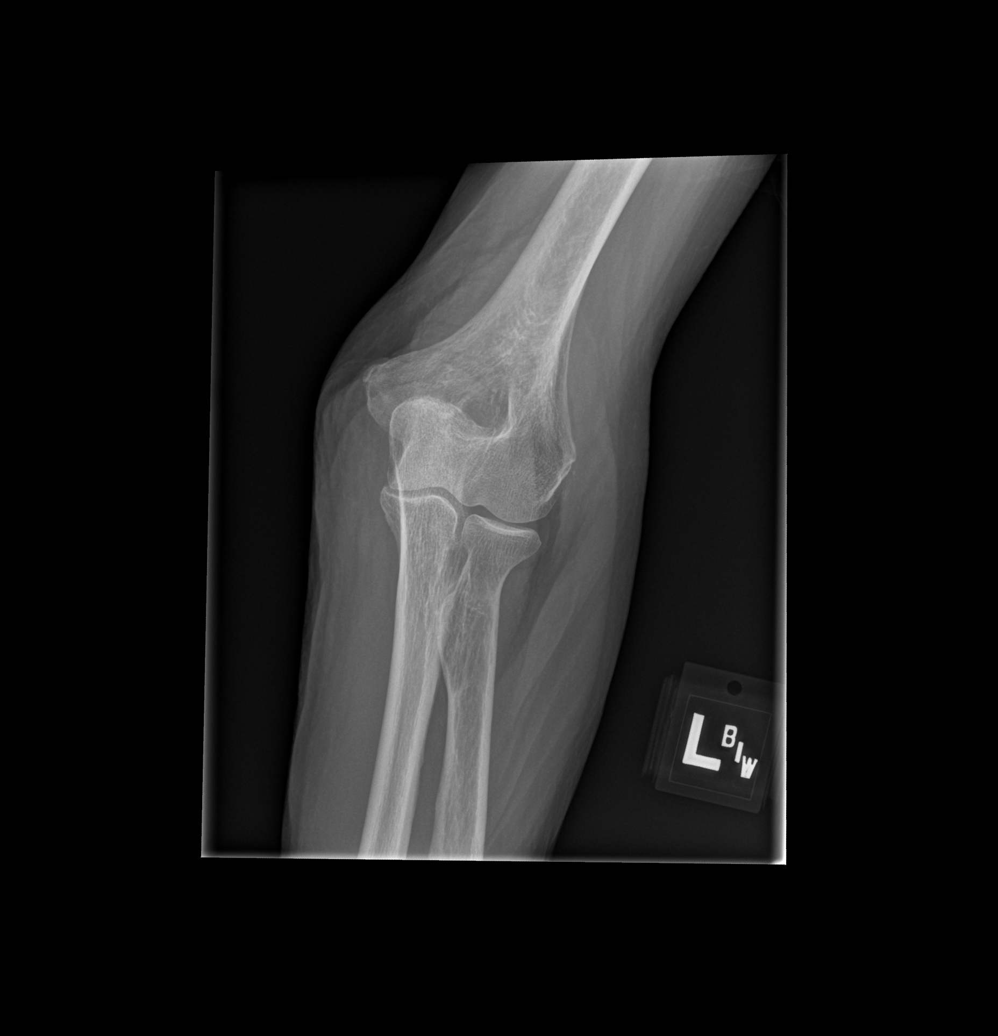

[x elbow obl left (2 of 2)]
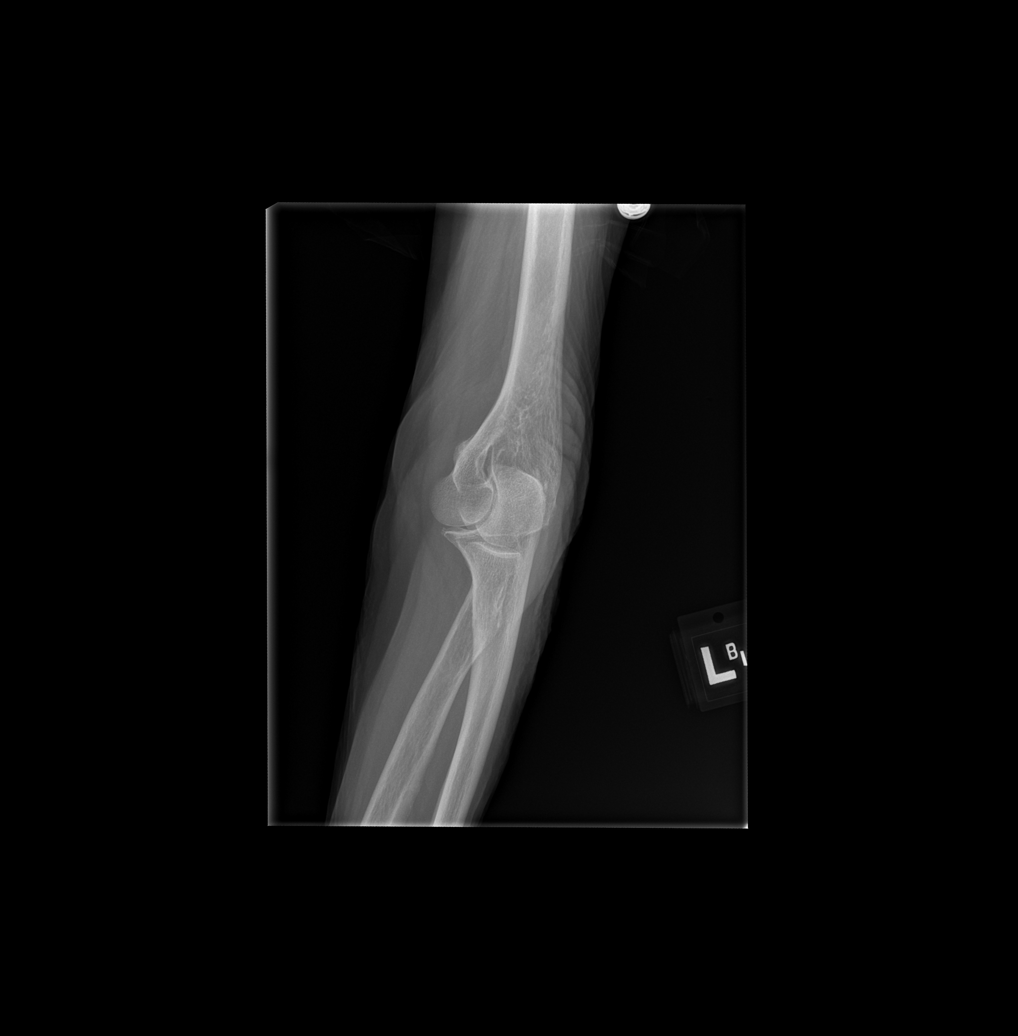

[x elbow lat left]
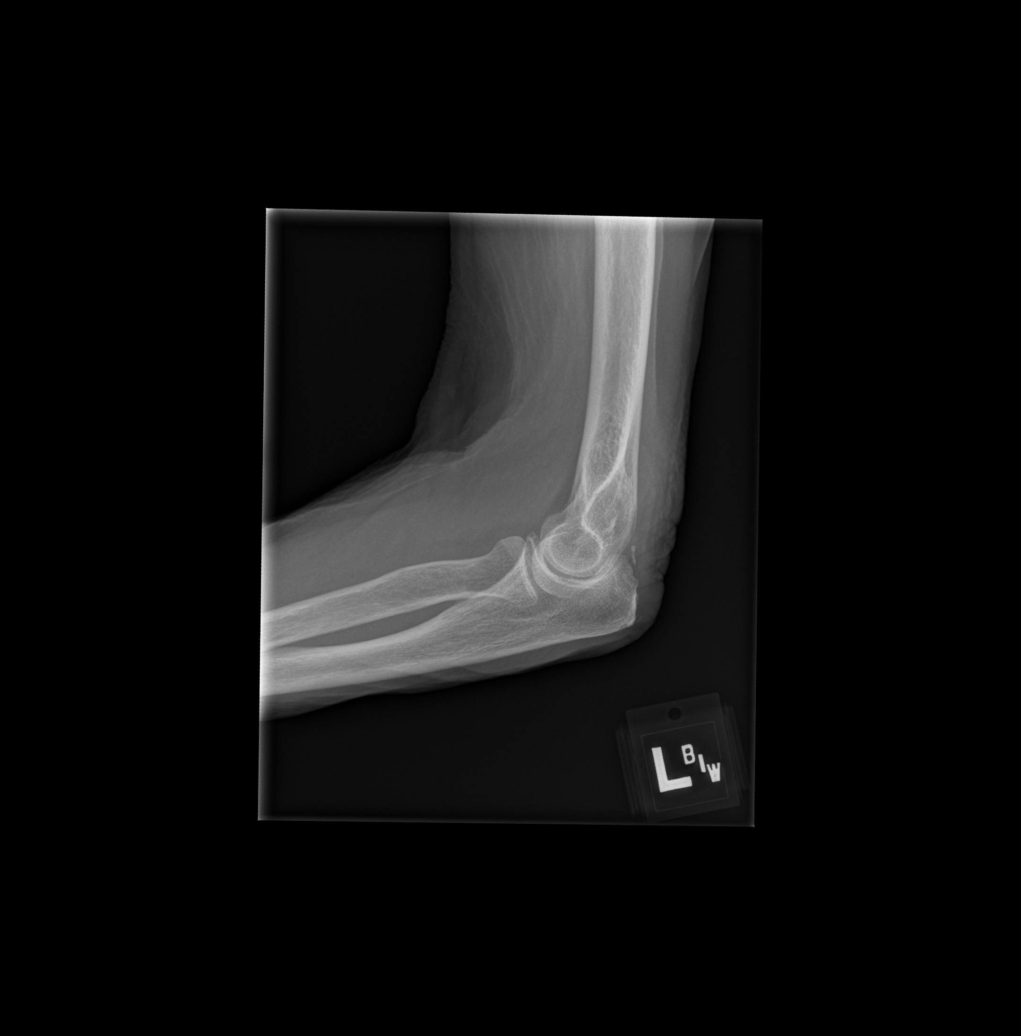

[4 of 4 positions shown; findings below may reference images not displayed]

FINDINGS: There is no evidence of fracture, dislocation, or joint effusion.
There is no evidence of arthropathy or other focal bone abnormality.
Soft tissues are unremarkable.
IMPRESSION: No acute abnormality noted.

## 2018-05-03 ENCOUNTER — Other Ambulatory Visit: Payer: Self-pay | Admitting: Internal Medicine

## 2018-05-17 ENCOUNTER — Ambulatory Visit (INDEPENDENT_AMBULATORY_CARE_PROVIDER_SITE_OTHER): Payer: Medicare Other | Admitting: Podiatry

## 2018-05-17 ENCOUNTER — Encounter: Payer: Self-pay | Admitting: Podiatry

## 2018-05-17 ENCOUNTER — Other Ambulatory Visit: Payer: Self-pay

## 2018-05-17 VITALS — Temp 97.3°F

## 2018-05-17 DIAGNOSIS — M79676 Pain in unspecified toe(s): Secondary | ICD-10-CM

## 2018-05-17 DIAGNOSIS — Z7901 Long term (current) use of anticoagulants: Secondary | ICD-10-CM

## 2018-05-17 DIAGNOSIS — B351 Tinea unguium: Secondary | ICD-10-CM | POA: Diagnosis not present

## 2018-05-17 DIAGNOSIS — Z9229 Personal history of other drug therapy: Secondary | ICD-10-CM

## 2018-05-17 NOTE — Progress Notes (Signed)
Subjective: Loretta Todd is a 85 y.o. y.o. female who is on long term blood thinner Eliquis presents today with painful, discolored, thick toenails  which interfere with daily activities. Pain is aggravated when wearing enclosed shoe gear. Pain is relieved with periodic professional debridement.  Patient's PCP is Gwenlyn Found, MD and last date seen was 11/24/2017.  Patient relates she had a fall while walking her dog and injured her right leg. It is being treated by her caretaker with daily dressing changes.    Current Outpatient Medications:  .  amoxicillin-clavulanate (AUGMENTIN) 875-125 MG tablet, TK 1 T PO BID FOR 10 DAYS, Disp: , Rfl:  .  doxycycline (VIBRA-TABS) 100 MG tablet, , Disp: , Rfl:  .  ELIQUIS 2.5 MG TABS tablet, TAKE ONE TABLET TWICE DAILY, Disp: 60 tablet, Rfl: 6 .  Fluticasone-Salmeterol (ADVAIR DISKUS) 250-50 MCG/DOSE AEPB, INHALE TWICE DAILY, Disp: 60 each, Rfl: 0 .  metoprolol succinate (TOPROL-XL) 25 MG 24 hr tablet, TAKE ONE TABLET EACH DAY, Disp: 90 tablet, Rfl: 3 .  oxyCODONE (OXY IR/ROXICODONE) 5 MG immediate release tablet, TAKE 1 TO 2 TABLETS EVERY 6 HOURS AS NEEDED FOR PAIN, Disp: , Rfl:  .  predniSONE (DELTASONE) 5 MG tablet, Take by mouth., Disp: , Rfl:  .  telmisartan-hydrochlorothiazide (MICARDIS HCT) 40-12.5 MG tablet, TAKE ONE TABLET DAILY, Disp: 30 tablet, Rfl: 2   No Known Allergies   Objective: Vascular Examination: Capillary refill time immediate x 10 digits.  Dorsalis pedis pulses palpable b/l.  Posterior tibial pulses palpable b/l.  No digital hair x 10 digits.  Skin temperature gradient WNL.   Dermatological Examination: Skin thin and atrophic BLE.  Dressing right lower leg wrapped in [place]. Clean, dry and intact.  Toenails 1-5 b/l discolored, thick, dystrophic with subungual debris and pain with palpation to nailbeds due to thickness of nails.  No open wounds b/l.  No interdigital macerations  b/l.  Musculoskeletal: Muscle strength 5/5 to all LE muscle groups.  Neurological: Sensation intact with 10 gram monofilament.  Vibratory sensation intact.  Assessment: Painful onychomycosis toenails 1-5 b/l in patient on blood thinner.   Plan: 1. Toenails 1-5 b/l were debrided in length and girth without iatrogenic bleeding. 2. Patient to continue soft, supportive shoe gear daily. 3. Patient to report any pedal injuries to medical professional immediately. 4. Avoid self trimming due to use of blood thinner. 5. Follow up 3 months.  6. Patient/POA to call should there be a concern in the interim.

## 2018-05-17 NOTE — Patient Instructions (Signed)

## 2018-05-19 ENCOUNTER — Ambulatory Visit: Payer: Medicare Other | Admitting: Podiatry

## 2018-05-31 ENCOUNTER — Telehealth: Payer: Self-pay | Admitting: Cardiology

## 2018-05-31 NOTE — Telephone Encounter (Signed)
Phone Only, no video, pre-reg complete, verbal consent given 05/31/2018 MS

## 2018-06-01 NOTE — Progress Notes (Signed)
Virtual Visit via Telephone Note   This visit type was conducted due to national recommendations for restrictions regarding the COVID-19 Pandemic (e.g. social distancing) in an effort to limit this patient's exposure and mitigate transmission in our community.  Due to her co-morbid illnesses, this patient is at least at moderate risk for complications without adequate follow up.  This format is felt to be most appropriate for this patient at this time.  The patient did not have access to video technology/had technical difficulties with video requiring transitioning to audio format only (telephone).  All issues noted in this document were discussed and addressed.  No physical exam could be performed with this format.  Please refer to the patient's chart for her  consent to telehealth for [facility].   Date:  06/02/2018   ID:  Loretta SpikeEleanor S Heinecke, DOB August 20, 1921, MRN [id number]  Patient Location: Home Provider Location: Home  PCP:  Gwenlyn FoundEksir, Samantha A, MD  Cardiologist:  Rollene RotundaJames Vickie Melnik, MD  Electrophysiologist:  None   Evaluation Performed:  Follow-Up Visit  Chief Complaint:  Atrial Fib  History of Present Illness:    Loretta Spikeleanor S Todd is a 85 y.o. female who presents for follow up of atrial fib.  Since I last talked to her she has done very well.  She denies any cardiovascular symptoms.  She is not having any palpitations, presyncope or syncope.  She is having no weight gain or edema.  She is being very careful and has not had any falls.  She is had some falls in the past with leg wounds.  She still has some leg wounds are being dressed but it sounds like she has home health care that checks on these.  She does not feel her heart racing or skipping.  She has no bleeding issues.  She is not going out except to ride in the car and she says that caregivers to come to her house are being very careful.  Has had no cough fevers or chills.  The patient does not have symptoms concerning for COVID-19  infection (fever, chills, cough, or new shortness of breath).    Past Medical History:  Diagnosis Date  . Asthma   . Lumbar radiculopathy   . Mitral late systolic murmur   . OA (osteoarthritis)    DIPs and PIPs  . Rotator cuff tendinitis    Past Surgical History:  Procedure Laterality Date  . CARDIOVERSION N/A 03/20/2016   Procedure: CARDIOVERSION;  Surgeon: Quintella Reichertraci R Turner, MD;  Location: [facility];  Service: Cardiovascular;  Laterality: N/A;  . EAR CYST EXCISION Left 06/20/2013   Procedure: DEBRIDE MUCOID CYST/REMOVE LOOSE BODIES LEFT RING FINGER ;  Surgeon: Wyn Forsterobert Sypher Jr V, MD;  Location: [facility];  Service: Orthopedics;  Laterality: Left;  . EYE SURGERY     both cataracts  . KNEE ARTHROSCOPY W/ MENISCAL REPAIR  2010   right  . TONSILLECTOMY      Prior to Admission medications   Medication Sig Start Date End Date Taking? Authorizing Provider  amoxicillin-clavulanate (AUGMENTIN) 875-125 MG tablet TK 1 T PO BID FOR 10 DAYS 11/24/17   [provider]  doxycycline (VIBRA-TABS) 100 MG tablet  01/10/18   [provider]  ELIQUIS 2.5 MG TABS tablet TAKE ONE TABLET TWICE DAILY 11/22/17   Rollene RotundaHochrein, Amy Gothard, MD  Fluticasone-Salmeterol (ADVAIR DISKUS) 250-50 MCG/DOSE AEPB INHALE TWICE DAILY 12/17/17   Nyoka CowdenWert, Michael B, MD  metoprolol succinate (TOPROL-XL) 25 MG 24 hr tablet TAKE ONE  TABLET EACH DAY 06/07/17   Rollene Rotunda, MD  oxyCODONE (OXY IR/ROXICODONE) 5 MG immediate release tablet TAKE 1 TO 2 TABLETS EVERY 6 HOURS AS NEEDED FOR PAIN 10/21/17   [provider]  predniSONE (DELTASONE) 5 MG tablet Take by mouth. 08/09/17 08/09/18  [provider]  telmisartan-hydrochlorothiazide (MICARDIS HCT) 40-12.5 MG tablet TAKE ONE TABLET DAILY 05/03/18   Nyoka Cowden, MD     Allergies:   Patient has no known allergies.   Social History   Tobacco Use  . Smoking status: Former Smoker    Packs/day: 0.50    Years: 54.00    Pack years: 27.00     Types: Cigarettes    Last attempt to quit: 06/14/1996    Years since quitting: 21.9  . Smokeless tobacco: Never Used  Substance Use Topics  . Alcohol use: Yes    Comment: daily scotch  . Drug use: No     Family Hx: The patient's family history includes Cancer in her father.  ROS:   Please see the history of present illness.    As stated in the HPI and negative for all other systems.   Prior CV studies:   The following studies were reviewed today:  None  Labs/Other Tests and Data Reviewed:    EKG:  No ECG reviewed.  Recent Labs: 01/18/2018: BUN 30; Creatinine, Ser 1.08; Potassium 4.7; Sodium 138   Recent Lipid Panel No results found for: CHOL, TRIG, HDL, CHOLHDL, LDLCALC, LDLDIRECT  Wt Readings from Last 3 Encounters:  01/18/18 141 lb (64 kg)  06/02/17 146 lb (66.2 kg)  09/21/16 140 lb (63.5 kg)     Objective:    Vital Signs:  There were no vitals taken for this visit.   VITAL SIGNS:  reviewed GEN:  no acute distress EYES:  sclerae anicteric, EOMI - Extraocular Movements Intact NEURO:  alert and oriented x 3, no obvious focal deficit PSYCH:  normal affect  ASSESSMENT & PLAN:     PERSISTENT ATRIAL FIB:    She tolerates this rhythm and tolerates anticoagulation.  She should have a CBC the next time she is in her primary care office but I do not think it is necessary for her to make a special trip for this.  No change in therapy is indicated.   COVID-19 Education: The signs and symptoms of COVID-19 were discussed with the patient and how to seek care for testing (follow up with PCP or arrange E-visit).  The importance of social distancing was discussed today.  Time:   Today, I have spent 17 minutes with the patient with telehealth technology discussing the above problems.     Medication Adjustments/Labs and Tests Ordered: Current medicines are reviewed at length with the patient today.  Concerns regarding medicines are outlined above.   Tests Ordered: No  orders of the defined types were placed in this encounter.   Medication Changes: No orders of the defined types were placed in this encounter.   Disposition:  Follow up wirh me virtually in 12 months  Signed, Rollene Rotunda, MD  06/02/2018 10:57 AM    [facility]

## 2018-06-02 ENCOUNTER — Telehealth (INDEPENDENT_AMBULATORY_CARE_PROVIDER_SITE_OTHER): Payer: Medicare Other | Admitting: Cardiology

## 2018-06-02 ENCOUNTER — Encounter: Payer: Self-pay | Admitting: Cardiology

## 2018-06-02 DIAGNOSIS — Z7189 Other specified counseling: Secondary | ICD-10-CM

## 2018-06-02 DIAGNOSIS — I4819 Other persistent atrial fibrillation: Secondary | ICD-10-CM | POA: Diagnosis not present

## 2018-06-02 NOTE — Patient Instructions (Signed)

## 2018-06-06 ENCOUNTER — Ambulatory Visit: Payer: Medicare Other | Admitting: Podiatry

## 2018-06-08 ENCOUNTER — Telehealth: Payer: Self-pay | Admitting: Cardiology

## 2018-06-08 NOTE — Telephone Encounter (Signed)
New Message   Daughter of patient calling stating during the virtual visit her mother told Dr. Antoine Poche that she had no bleeding but daughter states she's been have rectal bleeding and has been very fatigued and lethargic.  Please call daughter back at number provided if in the next hour but if later call [phone].

## 2018-06-08 NOTE — Telephone Encounter (Signed)
Returned call to patient's daughter Marliss Czar no answer.No voice mail.

## 2018-06-09 ENCOUNTER — Emergency Department (HOSPITAL_COMMUNITY)
Admission: EM | Admit: 2018-06-09 | Discharge: 2018-06-09 | Disposition: A | Discharge status: Home / Self Care | Payer: Medicare Other | Attending: Emergency Medicine | Admitting: Emergency Medicine

## 2018-06-09 ENCOUNTER — Encounter (HOSPITAL_COMMUNITY): Payer: Self-pay | Admitting: Emergency Medicine

## 2018-06-09 DIAGNOSIS — Z7901 Long term (current) use of anticoagulants: Secondary | ICD-10-CM | POA: Diagnosis not present

## 2018-06-09 DIAGNOSIS — K625 Hemorrhage of anus and rectum: Secondary | ICD-10-CM | POA: Diagnosis present

## 2018-06-09 LAB — ABO/RH: ABO/RH(D): O POS

## 2018-06-09 LAB — COMPREHENSIVE METABOLIC PANEL
ALT: 16 U/L (ref 0–44)
AST: 21 U/L (ref 15–41)
Albumin: 3.4 g/dL — ABNORMAL LOW (ref 3.5–5.0)
Alkaline Phosphatase: 65 U/L (ref 38–126)
Anion gap: 11 (ref 5–15)
BUN: 21 mg/dL (ref 8–23)
CO2: 21 mmol/L — ABNORMAL LOW (ref 22–32)
Calcium: 9.2 mg/dL (ref 8.9–10.3)
Chloride: 101 mmol/L (ref 98–111)
Creatinine, Ser: 0.98 mg/dL (ref 0.44–1.00)
GFR calc Af Amer: 57 mL/min — ABNORMAL LOW (ref >60)
GFR calc non Af Amer: 49 mL/min — ABNORMAL LOW (ref >60)
Glucose, Bld: 118 mg/dL — ABNORMAL HIGH (ref 70–99)
Potassium: 4.1 mmol/L (ref 3.5–5.1)
Sodium: 133 mmol/L — ABNORMAL LOW (ref 135–145)
Total Bilirubin: 1.6 mg/dL — ABNORMAL HIGH (ref 0.3–1.2)
Total Protein: 6.2 g/dL — ABNORMAL LOW (ref 6.5–8.1)

## 2018-06-09 LAB — CBC
HCT: 37.8 % (ref 36.0–46.0)
Hemoglobin: 13 g/dL (ref 12.0–15.0)
MCH: 33 pg (ref 26.0–34.0)
MCHC: 34.4 g/dL (ref 30.0–36.0)
MCV: 95.9 fL (ref 80.0–100.0)
Platelets: 203 10*3/uL (ref 150–400)
RBC: 3.94 MIL/uL (ref 3.87–5.11)
RDW: 13.2 % (ref 11.5–15.5)
WBC: 9.8 10*3/uL (ref 4.0–10.5)
nRBC: 0 % (ref 0.0–0.2)

## 2018-06-09 LAB — TYPE AND SCREEN
ABO/RH(D): O POS
Antibody Screen: NEGATIVE

## 2018-06-09 LAB — POC OCCULT BLOOD, ED: Fecal Occult Bld: POSITIVE — AB

## 2018-06-09 NOTE — ED Notes (Signed)
Pt ambulatory to bathroom without any problems 

## 2018-06-09 NOTE — ED Provider Notes (Signed)
[facility] EMERGENCY DEPARTMENT Provider Note   CSN: [id number] Arrival date & time: 06/09/18  1222    History   Chief Complaint No chief complaint on file.   HPI Adreana Coull is a 85 y.o. female.     85 yo F with a chief complaints of bright red stool.  This is been off and on since December.  Gotten worse over the past few days.  No abdominal tenderness feeling weak and having difficulty ambulating.  Denies fevers or chills.  Patient is on a blood thinner for atrial fibrillation.  The history is provided by the patient and a relative.  Rectal Bleeding  Quality:  Bright red Amount:  Copious Duration:  6 months Timing:  Intermittent Chronicity:  Recurrent Similar prior episodes: yes   Relieved by:  Nothing Worsened by:  Nothing Ineffective treatments:  None tried Associated symptoms: no abdominal pain, no dizziness, no fever and no vomiting     History reviewed. No pertinent past medical history.  There are no active problems to display for this patient.   History reviewed. No pertinent surgical history.   OB History   No obstetric history on file.      Home Medications    Prior to Admission medications   Medication Sig Start Date End Date Taking? Authorizing Provider  ADVAIR DISKUS 250-50 MCG/DOSE AEPB Take 1 puff by mouth daily. 12/16/17  Yes [provider]  ELIQUIS 2.5 MG TABS tablet Take 2.5 mg by mouth 2 (two) times a day. 06/02/18  Yes [provider]  metoprolol succinate (TOPROL-XL) 25 MG 24 hr tablet Take 25 mg by mouth daily. 12/23/17  Yes [provider]  predniSONE (DELTASONE) 5 MG tablet Take 5 mg by mouth daily. 06/02/18  Yes [provider]  telmisartan-hydrochlorothiazide (MICARDIS HCT) 40-12.5 MG tablet Take 1 tablet by mouth daily. 06/02/18  Yes [provider]    Family History No family history on file.  Social History Social History   Tobacco Use  . Smoking status: Not  on file  Substance Use Topics  . Alcohol use: Not on file  . Drug use: Not on file     Allergies   Patient has no known allergies.   Review of Systems Review of Systems  Constitutional: Negative for chills and fever.  HENT: Negative for congestion and rhinorrhea.   Eyes: Negative for redness and visual disturbance.  Respiratory: Negative for shortness of breath and wheezing.   Cardiovascular: Negative for chest pain and palpitations.  Gastrointestinal: Positive for blood in stool and hematochezia. Negative for abdominal pain, nausea and vomiting.  Genitourinary: Negative for dysuria and urgency.  Musculoskeletal: Negative for arthralgias and myalgias.  Skin: Negative for pallor and wound.  Neurological: Negative for dizziness and headaches.     Physical Exam Updated Vital Signs BP 110/70   Pulse 72   Resp (!) 21   SpO2 93%   Physical Exam Vitals signs and nursing note reviewed.  Constitutional:      General: She is not in acute distress.    Appearance: She is well-developed. She is not diaphoretic.  HENT:     Head: Normocephalic and atraumatic.  Eyes:     Pupils: Pupils are equal, round, and reactive to light.  Neck:     Musculoskeletal: Normal range of motion and neck supple.  Cardiovascular:     Rate and Rhythm: Normal rate and regular rhythm.     Heart sounds: No murmur. No friction rub. No  gallop.   Pulmonary:     Effort: Pulmonary effort is normal.     Breath sounds: No wheezing or rales.  Abdominal:     General: There is no distension.     Palpations: Abdomen is soft.     Tenderness: There is no abdominal tenderness.  Genitourinary:    Comments: Bright red blood per rectum Musculoskeletal:        General: No tenderness.  Skin:    General: Skin is warm and dry.  Neurological:     Mental Status: She is alert and oriented to person, place, and time.  Psychiatric:        Behavior: Behavior normal.      ED Treatments / Results  Labs (all labs  ordered are listed, but only abnormal results are displayed) Labs Reviewed  COMPREHENSIVE METABOLIC PANEL - Abnormal; Notable for the following components:      Result Value   Sodium 133 (*)    CO2 21 (*)    Glucose, Bld 118 (*)    Total Protein 6.2 (*)    Albumin 3.4 (*)    Total Bilirubin 1.6 (*)    GFR calc non Af Amer 49 (*)    GFR calc Af Amer 57 (*)    All other components within normal limits  POC OCCULT BLOOD, ED - Abnormal; Notable for the following components:   Fecal Occult Bld POSITIVE (*)    All other components within normal limits  CBC  TYPE AND SCREEN  ABO/RH    EKG EKG Interpretation  Date/Time:  Thursday June 09 2018 13:37:34 EDT Ventricular Rate:  84 PR Interval:    QRS Duration: 137 QT Interval:  409 QTC Calculation: 484 R Axis:   -75 Text Interpretation:  Atrial fibrillation Left bundle branch block No old tracing to compare Confirmed by Melene Plan [phone]) on 06/09/2018 2:06:13 PM   Radiology No results found.  Procedures Procedures (including critical care time)  Medications Ordered in ED Medications - No data to display   Initial Impression / Assessment and Plan / ED Course  I have reviewed the triage vital signs and the nursing notes.  Pertinent labs & imaging results that were available during my care of the patient were reviewed by me and considered in my medical decision making (see chart for details).        85 yo F with a chief complaints of bloody stool.  Patient has bright red blood on my rectal exam.  She is sent here by her GI doctor Dr. Elnoria Howard.  We will call them once her hemoglobin is resulted.  Hemoglobin is 13.  I discussed the case with Dr. Elnoria Howard, he felt if the family was willing and he would follow her up in the office on Monday.  I discussed with the family and they would prefer not to stay in the hospital.  Will call Dr. Elnoria Howard to set up an appointment.  4:12 PM:  I have discussed the diagnosis/risks/treatment options with  the patient and family and believe the pt to be eligible for discharge home to follow-up with GI. We also discussed returning to the ED immediately if new or worsening sx occur. We discussed the sx which are most concerning (e.g., worsening fatigue, syncope, worsening rectal bleeding, abdominal pain, fever) that necessitate immediate return. Medications administered to the patient during their visit and any new prescriptions provided to the patient are listed below.  Medications given during this visit Medications - No data to display  The patient appears reasonably screen and/or stabilized for discharge and I doubt any other medical condition or other [facility] requiring further screening, evaluation, or treatment in the ED at this time prior to discharge.     Final Clinical Impressions(s) / ED Diagnoses   Final diagnoses:  BRBPR (bright red blood per rectum)    ED Discharge Orders    None       Melene PlanFloyd, Kaleb Linquist, DO 06/09/18 1612

## 2018-06-09 NOTE — Telephone Encounter (Signed)
Returned call to daughter. She reports patient is having rectal bleeding, fatigued, lethargic. Patient was advised by GI MD Dr. Elnoria Howard to go ED for evaluation. Agreed with plan.   Daughter wants to know if patient should have a different medication or different dose. Advised would send message to MD. Daughter Marliss Czar is HCPOA

## 2018-06-09 NOTE — Discharge Instructions (Addendum)
Follow up with Dr. Elnoria Howard on Monday.  Return for worsening bleeding, if you feel more weak.

## 2018-06-09 NOTE — ED Notes (Signed)
See downtime sheet

## 2018-06-09 NOTE — Telephone Encounter (Signed)
Follow Up:    Daughter is calling back. She says she has problems with her phone sometimes. She was not aware that somebody had called back.

## 2018-06-10 ENCOUNTER — Encounter: Payer: Self-pay | Admitting: Cardiology

## 2018-06-10 NOTE — Telephone Encounter (Signed)
She was seen in the ED.  She is seeing Dr. Elnoria Howard on Monday.  Would need to stop Eliquis if she is having active bleeding.  I saw the ED report and Hgb was 13 and Eliquis not stopped.  She should wait to talk to Dr. Elnoria Howard

## 2018-06-10 NOTE — Telephone Encounter (Signed)
Message sent to patient

## 2018-07-06 DEATH — deceased

## 2018-08-22 ENCOUNTER — Ambulatory Visit: Payer: Medicare Other | Admitting: Podiatry
